# Patient Record
Sex: Female | Born: 1990 | Race: White | Hispanic: No | Marital: Single | Smoking: Never smoker
Health system: Southern US, Community
[De-identification: ages and names within clinical notes are randomized; demographics above are authoritative.]

## PROBLEM LIST (undated history)

## (undated) DIAGNOSIS — Z1159 Encounter for screening for other viral diseases: Secondary | ICD-10-CM

## (undated) DIAGNOSIS — B009 Herpesviral infection, unspecified: Secondary | ICD-10-CM

## (undated) DIAGNOSIS — S060X9A Concussion with loss of consciousness of unspecified duration, initial encounter: Secondary | ICD-10-CM

## (undated) DIAGNOSIS — E162 Hypoglycemia, unspecified: Secondary | ICD-10-CM

## (undated) DIAGNOSIS — G43109 Migraine with aura, not intractable, without status migrainosus: Secondary | ICD-10-CM

## (undated) HISTORY — DX: Concussion with loss of consciousness of unspecified duration, initial encounter: S06.0X9A

## (undated) HISTORY — DX: Encounter for screening for other viral diseases: Z11.59

## (undated) HISTORY — DX: Hypoglycemia, unspecified: E16.2

## (undated) HISTORY — DX: Herpesviral infection, unspecified: B00.9

## (undated) HISTORY — DX: Migraine with aura, not intractable, without status migrainosus: G43.109

---
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
## 2012-05-29 ENCOUNTER — Ambulatory Visit (INDEPENDENT_AMBULATORY_CARE_PROVIDER_SITE_OTHER): Payer: Managed Care, Other (non HMO) | Admitting: Family Medicine

## 2012-05-29 VITALS — BP 108/68 | HR 94 | Temp 98.8°F | Resp 16 | Ht 67.5 in | Wt 131.0 lb

## 2012-05-29 DIAGNOSIS — B349 Viral infection, unspecified: Secondary | ICD-10-CM

## 2012-05-29 DIAGNOSIS — B9789 Other viral agents as the cause of diseases classified elsewhere: Secondary | ICD-10-CM

## 2012-05-29 DIAGNOSIS — J209 Acute bronchitis, unspecified: Secondary | ICD-10-CM

## 2012-05-29 DIAGNOSIS — R059 Cough, unspecified: Secondary | ICD-10-CM

## 2012-05-29 DIAGNOSIS — R05 Cough: Secondary | ICD-10-CM

## 2012-05-29 LAB — POCT CBC
Granulocyte percent: 75.8 %G (ref 37–80)
HCT, POC: 37.8 % (ref 37.7–47.9)
Hemoglobin: 12 g/dL — AB (ref 12.2–16.2)
POC Granulocyte: 6.1 (ref 2–6.9)
POC LYMPH PERCENT: 14.5 %L (ref 10–50)
RDW, POC: 12.5 %

## 2012-05-29 MED ORDER — PSEUDOEPHEDRINE HCL ER 120 MG PO TB12: 120.0000 mg | ORAL_TABLET | Freq: Two times a day (BID) | ORAL | Status: DC

## 2012-05-29 MED ORDER — HYDROCODONE-HOMATROPINE 5-1.5 MG/5ML PO SYRP: 5.0000 mL | ORAL_SOLUTION | Freq: Three times a day (TID) | ORAL | Status: DC | PRN

## 2012-05-29 MED ORDER — IPRATROPIUM BROMIDE 0.03 % NA SOLN: 2.0000 | Freq: Four times a day (QID) | NASAL | Status: DC

## 2012-05-29 NOTE — Patient Instructions (Addendum)
I recommend frequent warm salt water gargles, hot tea with honey and lemon, rest, and handwashing.  Hot showers or breathing in steam may help loosen the congestion.  Try a netti pot or sinus rinse is also likely to help you feel better and keep this from progressing.   Bronchitis Bronchitis is the body's way of reacting to injury and/or infection (inflammation) of the bronchi. Bronchi are the air tubes that extend from the windpipe into the lungs. If the inflammation becomes severe, it may cause shortness of breath. CAUSES  Inflammation may be caused by:  A virus.  Germs (bacteria).  Dust.  Allergens.  Pollutants and many other irritants. The cells lining the bronchial tree are covered with tiny hairs (cilia). These constantly beat upward, away from the lungs, toward the mouth. This keeps the lungs free of pollutants. When these cells become too irritated and are unable to do their job, mucus begins to develop. This causes the characteristic cough of bronchitis. The cough clears the lungs when the cilia are unable to do their job. Without either of these protective mechanisms, the mucus would settle in the lungs. Then you would develop pneumonia. Smoking is a common cause of bronchitis and can contribute to pneumonia. Stopping this habit is the single most important thing you can do to help yourself. TREATMENT   Your caregiver may prescribe an antibiotic if the cough is caused by bacteria. Also, medicines that open up your airways make it easier to breathe. Your caregiver may also recommend or prescribe an expectorant. It will loosen the mucus to be coughed up. Only take over-the-counter or prescription medicines for pain, discomfort, or fever as directed by your caregiver.  Removing whatever causes the problem (smoking, for example) is critical to preventing the problem from getting worse.  Cough suppressants may be prescribed for relief of cough symptoms.  Inhaled medicines may be  prescribed to help with symptoms now and to help prevent problems from returning.  For those with recurrent (chronic) bronchitis, there may be a need for steroid medicines. SEEK IMMEDIATE MEDICAL CARE IF:   During treatment, you develop more pus-like mucus (purulent sputum).  You have a fever.  Your baby is older than 3 months with a rectal temperature of 102 F (38.9 C) or higher.  Your baby is 13 months old or younger with a rectal temperature of 100.4 F (38 C) or higher.  You become progressively more ill.  You have increased difficulty breathing, wheezing, or shortness of breath. It is necessary to seek immediate medical care if you are elderly or sick from any other disease. MAKE SURE YOU:   Understand these instructions.  Will watch your condition.  Will get help right away if you are not doing well or get worse. Document Released: 12/26/2004 Document Revised: 03/20/2011 Document Reviewed: 11/05/2007 [facility] Patient Information 2014 [place], [place]. Acute Bronchitis You have acute bronchitis. This means you have a chest cold. The airways in your lungs are red and sore (inflamed). Acute means it is sudden onset.  CAUSES Bronchitis is most often caused by the same virus that causes a cold. SYMPTOMS   Body aches.  Chest congestion.  Chills.  Cough.  Fever.  Shortness of breath.  Sore throat. TREATMENT  Acute bronchitis is usually treated with rest, fluids, and medicines for relief of fever or cough. Most symptoms should go away after a few days or a week. Increased fluids may help thin your secretions and will prevent dehydration. Your caregiver may give  you an inhaler to improve your symptoms. The inhaler reduces shortness of breath and helps control cough. You can take over-the-counter pain relievers or cough medicine to decrease coughing, pain, or fever. A cool-air vaporizer may help thin bronchial secretions and make it easier to clear your chest. Antibiotics  are usually not needed but can be prescribed if you smoke, are seriously ill, have chronic lung problems, are elderly, or you are at higher risk for developing complications.Allergies and asthma can make bronchitis worse. Repeated episodes of bronchitis may cause longstanding lung problems. Avoid smoking and secondhand smoke.Exposure to cigarette smoke or irritating chemicals will make bronchitis worse. If you are a cigarette smoker, consider using nicotine gum or skin patches to help control withdrawal symptoms. Quitting smoking will help your lungs heal faster. Recovery from bronchitis is often slow, but you should start feeling better after 2 to 3 days. Cough from bronchitis frequently lasts for 3 to 4 weeks. To prevent another bout of acute bronchitis:  Quit smoking.  Wash your hands frequently to get rid of viruses or use a hand sanitizer.  Avoid other people with cold or virus symptoms.  Try not to touch your hands to your mouth, nose, or eyes. SEEK IMMEDIATE MEDICAL CARE IF:  You develop increased fever, chills, or chest pain.  You have severe shortness of breath or bloody sputum.  You develop dehydration, fainting, repeated vomiting, or a severe headache.  You have no improvement after 1 week of treatment or you get worse. MAKE SURE YOU:   Understand these instructions.  Will watch your condition.  Will get help right away if you are not doing well or get worse. Document Released: 02/03/2004 Document Revised: 03/20/2011 Document Reviewed: 04/20/2010 [facility] Patient Information 2014 [place], [place].

## 2012-05-29 NOTE — Progress Notes (Signed)
Subjective:    Patient ID: Margaret Cole, female    DOB: May 27, 1990, 22 y.o.   MRN: [id number]   Chief Complaint  Patient presents with  . Cough    x 4 days  . Sore Throat  . Back Pain  . Otalgia  . Nasal Congestion    HPI 4d ago - woke w/ little sore throat and that night started to cough and cough worsened and everything worsening.  Coughing up a little but getting stuck in throat, little bit of runny nose - white clear.  Felt a little feverish w/ sweats and chills but not documented. Both ears - behind them sore, no lymphnodes. No HAs but a lot of back aches, no sinus pressure, no pnd. Voiding normally, drinking well, eating well. Normal bowels now. Did have some diarrhea 3d ago Only slept a little - cough and back pain keeping her up.  No known sick contacts.  Trying delsym which hasn't been working very well.  On period now.  Had mono 1-2 yrs ago  History reviewed. No pertinent past medical history. No current outpatient prescriptions on file prior to visit.   Current Facility-Administered Medications on File Prior to Visit  Medication Dose Route Frequency Provider Last Rate Last Dose  . meningococcal polysaccharide (MENACTRA) injection 0.5 mL  0.5 mL Intramuscular Once Shade Flood, MD      . Lady Gary Leda Min) injection 0.5 mL  0.5 mL Intramuscular Once Shade Flood, MD       No Known Allergies   Review of Systems  Constitutional: Positive for fever, chills, diaphoresis, activity change and fatigue. Negative for appetite change and unexpected weight change.  HENT: Positive for ear pain, congestion, sore throat and rhinorrhea. Negative for postnasal drip and sinus pressure.   Respiratory: Positive for cough.   Gastrointestinal: Positive for diarrhea. Negative for nausea, vomiting, abdominal pain and constipation.  Genitourinary: Positive for vaginal bleeding. Negative for frequency and decreased urine volume.  Musculoskeletal: Positive for back pain.  Neurological:  Negative for headaches.  Hematological: Negative for adenopathy.  Psychiatric/Behavioral: Positive for sleep disturbance.       BP 108/68  Pulse 94  Temp(Src) 98.8 F (37.1 C)  Resp 16  Ht 5' 7.5" (1.715 m)  Wt 131 lb (59.421 kg)  BMI 20.2 kg/m2  SpO2 97%  LMP 05/29/2012 Objective:   Physical Exam  HENT:  Right Ear: A middle ear effusion is present.  Left Ear: A middle ear effusion is present.  Nose: Mucosal edema and rhinorrhea present.  Mouth/Throat: Posterior oropharyngeal edema and posterior oropharyngeal erythema present. No oropharyngeal exudate or tonsillar abscesses.  Tonsils 3+ bilaterally  Abdominal: There is no hepatosplenomegaly. There is tenderness in the right upper quadrant. There is no CVA tenderness.  Lymphadenopathy:       Head (right side): Submandibular and tonsillar adenopathy present.       Head (left side): Submandibular and tonsillar adenopathy present.    She has cervical adenopathy.       Right cervical: Superficial cervical and posterior cervical adenopathy present.       Left cervical: Superficial cervical and posterior cervical adenopathy present.      Results for orders placed in visit on 05/29/12  POCT CBC      Result Value Range   WBC 8.0  4.6 - 10.2 K/uL   Lymph, poc 1.2  0.6 - 3.4   POC LYMPH PERCENT 14.5  10 - 50 %L   MID (cbc) 0.8  0 -  0.9   POC MID % 9.7  0 - 12 %M   POC Granulocyte 6.1  2 - 6.9   Granulocyte percent 75.8  37 - 80 %G   RBC 3.85 (*) 4.04 - 5.48 M/uL   Hemoglobin 12.0 (*) 12.2 - 16.2 g/dL   HCT, POC 16.1  09.6 - 47.9 %   MCV 98.1 (*) 80 - 97 fL   MCH, POC 31.2  27 - 31.2 pg   MCHC 31.7 (*) 31.8 - 35.4 g/dL   RDW, POC 04.5     Platelet Count, POC 163  142 - 424 K/uL   MPV 9.3  0 - 99.8 fL    Assessment & Plan:  Cough - Plan: POCT CBC  Viral syndrome  Acute bronchitis  Meds ordered this encounter  Medications  . HYDROcodone-homatropine (HYCODAN) 5-1.5 MG/5ML syrup    Sig: Take 5 mLs by mouth every 8  (eight) hours as needed for cough.    Dispense:  120 mL    Refill:  0  . pseudoephedrine (SUDAFED 12 HOUR) 120 MG 12 hr tablet    Sig: Take 1 tablet (120 mg total) by mouth every 12 (twelve) hours.    Dispense:  30 tablet    Refill:  0  .  ipratropium (ATROVENT) 0.03 % nasal spray    Sig: Place 2 sprays into the nose 4 (four) times daily.    Dispense:  30 mL    Refill:  1

## 2012-08-28 ENCOUNTER — Encounter: Payer: Self-pay | Admitting: Obstetrics and Gynecology

## 2012-08-28 ENCOUNTER — Ambulatory Visit (INDEPENDENT_AMBULATORY_CARE_PROVIDER_SITE_OTHER): Payer: Managed Care, Other (non HMO) | Admitting: Obstetrics and Gynecology

## 2012-08-28 VITALS — BP 100/66 | HR 76 | Ht 66.5 in | Wt 129.0 lb

## 2012-08-28 DIAGNOSIS — Z23 Encounter for immunization: Secondary | ICD-10-CM

## 2012-08-28 DIAGNOSIS — Z0000 Encounter for general adult medical examination without abnormal findings: Secondary | ICD-10-CM

## 2012-08-28 DIAGNOSIS — N920 Excessive and frequent menstruation with regular cycle: Secondary | ICD-10-CM

## 2012-08-28 DIAGNOSIS — N760 Acute vaginitis: Secondary | ICD-10-CM

## 2012-08-28 DIAGNOSIS — N946 Dysmenorrhea, unspecified: Secondary | ICD-10-CM

## 2012-08-28 DIAGNOSIS — Z01419 Encounter for gynecological examination (general) (routine) without abnormal findings: Secondary | ICD-10-CM

## 2012-08-28 LAB — POCT URINALYSIS DIPSTICK
Blood, UA: NEGATIVE
Glucose, UA: NEGATIVE
Nitrite, UA: NEGATIVE
Protein, UA: NEGATIVE
Urobilinogen, UA: NEGATIVE

## 2012-08-28 LAB — HEMOGLOBIN, FINGERSTICK: Hemoglobin, fingerstick: 14.5 g/dL (ref 12.0–16.0)

## 2012-08-28 MED ORDER — NORETHIN ACE-ETH ESTRAD-FE 1-20 MG-MCG(24) PO TABS: 1.0000 | ORAL_TABLET | Freq: Every day | ORAL | Status: DC

## 2012-08-28 NOTE — Patient Instructions (Addendum)
EXERCISE AND DIET:  We recommended that you start or continue a regular exercise program for good health. Regular exercise means any activity that makes your heart beat faster and makes you sweat.  We recommend exercising at least 30 minutes per day at least 3 days a week, preferably 4 or 5.  We also recommend a diet low in fat and sugar.  Inactivity, poor dietary choices and obesity can cause diabetes, heart attack, stroke, and kidney damage, among others.    ALCOHOL AND SMOKING:  Women should limit their alcohol intake to no more than 7 drinks/beers/glasses of wine (combined, not each!) per week. Moderation of alcohol intake to this level decreases your risk of breast cancer and liver damage. And of course, no recreational drugs are part of a healthy lifestyle.  And absolutely no smoking or even second hand smoke. Most people know smoking can cause heart and lung diseases, but did you know it also contributes to weakening of your bones? Aging of your skin?  Yellowing of your teeth and nails?  CALCIUM AND VITAMIN D:  Adequate intake of calcium and Vitamin D are recommended.  The recommendations for exact amounts of these supplements seem to change often, but generally speaking 600 mg of calcium (either carbonate or citrate) and 800 units of Vitamin D per day seems prudent. Certain women may benefit from higher intake of Vitamin D.  If you are among these women, your doctor will have told you during your visit.    PAP SMEARS:  Pap smears, to check for cervical cancer or precancers,  have traditionally been done yearly, although recent scientific advances have shown that most women can have pap smears less often.  However, every woman still should have a physical exam from her gynecologist every year. It will include a breast check, inspection of the vulva and vagina to check for abnormal growths or skin changes, a visual exam of the cervix, and then an exam to evaluate the size and shape of the uterus and  ovaries.  And after 20 years of age, a rectal exam is indicated to check for rectal cancers. We will also provide age appropriate advice regarding health maintenance, like when you should have certain vaccines, screening for sexually transmitted diseases, bone density testing, colonoscopy, mammograms, etc.   MAMMOGRAMS:  All women over 40 years old should have a yearly mammogram. Many facilities now offer a "3D" mammogram, which may cost around $50 extra out of pocket. If possible,  we recommend you accept the option to have the 3D mammogram performed.  It both reduces the number of women who will be called back for extra views which then turn out to be normal, and it is better than the routine mammogram at detecting truly abnormal areas.    COLONOSCOPY:  Colonoscopy to screen for colon cancer is recommended for all women at age 50.  We know, you hate the idea of the prep.  We agree, BUT, having colon cancer and not knowing it is worse!!  Colon cancer so often starts as a polyp that can be seen and removed at colonscopy, which can quite literally save your life!  And if your first colonoscopy is normal and you have no family history of colon cancer, most women don't have to have it again for 10 years.  Once every ten years, you can do something that may end up saving your life, right?  We will be happy to help you get it scheduled when you are ready.    Be sure to check your insurance coverage so you understand how much it will cost.  It may be covered as a preventative service at no cost, but you should check your particular policy.    HPV Vaccine Questions and Answers WHAT IS HUMAN PAPILLOMAVIRUS (HPV)? HPV is a virus that can lead to cervical cancer; vulvar and vaginal cancers; penile cancer; anal cancer and genital warts (warts in the genital areas). More than 1 vaccine is available to help you or your child with protection against HPV. Your caregiver can talk to you about which one might give you the  best protection. WHO SHOULD GET THIS VACCINE? The HPV vaccine is most effective when given before the onset of sexual activity.  This vaccine is recommended for girls 25 or 20 years of age. It can be given to girls as young as 20 years old.  HPV vaccine can be given to males, 9 through 20 years of age, to reduce the likelihood of acquiring genital warts.  HPV vaccine can be given to males and females aged 37 through 26 years to prevent anal cancer. HPV vaccine is not generally recommended after age 61, because most individuals have been exposed to the HPV virus by that age. HOW EFFECTIVE IS THIS VACCINE?  The vaccine is generally effective in preventing cervical; vulvar and vaginal cancers; penile cancer; anal cancer and genital warts caused by 4 types of HPV. The vaccine is less effective in those individuals who are already infected with HPV. This vaccine does not treat existing HPV, genital warts, pre-cancers or cancers. WILL SEXUALLY ACTIVE INDIVIDUALS BENEFIT FROM THE VACCINE? Sexually active individuals may still benefit from the vaccine but may get less benefit due to previous HPV exposure. HOW AND WHEN IS THE VACCINE ADMINISTERED? The vaccine is given in a series of 3 injections (shots) over a 6 month period in both males and females. The exact timing depends on which specific vaccine your caregiver recommends for you. IS THE HPV VACCINE SAFE?  The federal government has approved the HPV vaccine as safe and effective. This vaccine was tested in both males and females in many countries around the world. The most common side effect is soreness at the injection site. Since the drug became approved, there has been some concern about patients passing out after being vaccinated, which has led to a recommendation of a 15 minute waiting period following vaccination. This practice may decrease the small risk of passing out. Additionally there is a rare risk of anaphylaxis (an allergic reaction) to the  vaccine and a risk of a blood clot among individuals with specific risk factors for a blood clot. DOES THIS VACCINE CONTAIN THIMEROSAL OR MERCURY? No. There is no thimerosal or mercury in the HPV vaccine. It is made of proteins from the outer coat of the virus (HPV). There is no infectious material in this vaccine. WILL GIRLS/WOMEN WHO HAVE BEEN VACCINATED STILL NEED CERVICAL CANCER SCREENING? Yes. There are 3 reasons why women will still need regular cervical cancer screening. First, the vaccine will NOT provide protection against all types of HPV that cause cervical cancer. Vaccinated women will still be at risk for some cancers. Second, some women may not get all required doses of the vaccine (or they may not get them at the recommended times). Therefore, they may not get the vaccine's full benefits. Third, women may not get the full benefit of the vaccine if they receive it after they have already acquired any of the 4 types  of HPV. WILL THE HPV VACCINE BE COVERED BY INSURANCE PLANS? While some insurance companies may cover the vaccine, others may not. Most large group insurance plans cover the costs of recommended vaccines. WHAT KIND OF GOVERNMENT PROGRAMS MAY BE AVAILABLE TO COVER HPV VACCINE? Federal health programs such as Vaccines for [facility]) will cover the HPV vaccine. The [facility] program provides free vaccines to children and adolescents under 41 years of age, who are either uninsured, Medicaid-eligible, American [place] or [place] Native. There are over 45,000 sites that provide [facility] vaccines including hospital, private and public clinics. The [facility] program also allows children and adolescents to get VFC vaccines through [facility] or [facility] if their private health insurance does not cover the vaccine. Some states also provide free or low-cost vaccines, at public health clinics, to people without health insurance coverage for vaccines. GENITAL HPV: WHY IS HPV  IMPORTANT? Genital HPV is the most common virus transmitted through genital contact, most often during vaginal and anal sex. About 40 types of HPV can infect the genital areas of men and women. While most HPV types cause no symptoms and go away on their own, some types can cause cervical cancer in women. These types also cause other less common genital cancers, including cancers of the penis, anus, vagina (birth canal), and vulva (area around the opening of the vagina). Other types of HPV can cause genital warts in men and women. HOW COMMON IS HPV?   At least 50% of sexually active people will get HPV at some time in their lives. HPV is most common in young women and men who are in their late teens and early 72s.  Anyone who has ever had genital contact with another person can get HPV. Both men and women can get it and pass it on to their sex partners without realizing it. IS HPV THE SAME THING AS HIV OR HERPES? HPV is NOT the same as HIV or Herpes (Herpes simplex virus or HSV). While these are all viruses that can be sexually transmitted, HIV and HSV do not cause the same symptoms or health problems as HPV. CAN HPV AND ITS ASSOCIATED DISEASES BE TREATED? There is no treatment for HPV. There are treatments for the health problems that HPV can cause, such as genital warts, cervical cell changes, and cancers of the cervix (lower part of the womb), vulva, vagina and anus.  HOW IS HPV RELATED TO CERVICAL CANCER? Some types of HPV can infect a woman's cervix and cause the cells to change in an abnormal way. Most of the time, HPV goes away on its own. When HPV is gone, the cervical cells go back to normal. Sometimes, HPV does not go away. Instead, it lingers (persists) and continues to change the cells on a woman's cervix. These cell changes can lead to cancer over time if they are not treated. ARE THERE OTHER WAYS TO PREVENT CERVICAL CANCER? Regular Pap tests and follow-up can prevent most, but not all,  cases of cervical cancer. Pap tests can detect cell changes (or pre-cancers) in the cervix before they turn into cancer. Pap tests can also detect most, but not all, cervical cancers at an early, curable stage. Most women diagnosed with cervical cancer have either never had a Pap test, or not had a Pap test in the last 5 years. There is also an HPV DNA test available for use with the Pap test as part of cervical cancer screening. This test may be  ordered for women over 30 or for women who get an unclear (borderline) Pap test result. While this test can tell if a woman has HPV on her cervix, it cannot tell which types of HPV she has. If the HPV DNA test is negative for HPV DNA, then screening may be done every 3 years. If the HPV DNA test is positive for HPV DNA, then screening should be done every 6 to 12 months. OTHER QUESTIONS ABOUT THE HPV VACCINE WHAT HPV TYPES DOES THE VACCINE PROTECT AGAINST? The HPV vaccine protects against the HPV types that cause most (70%) cervical cancers (types 16 and 18), most (78%) anal cancers (types 16 and 18) and the two HPV types that cause most (90%) genital warts (types 6 and 11). WHAT DOES THE VACCINE NOT PROTECT AGAINST?  Because the vaccine does not protect against all types of HPV, it will not prevent all cases of cervical cancer, anal cancer, other genital cancers or genital warts. About 30% of cervical cancers are not prevented with vaccination, so it will be important for women to continue screening for cervical cancer (regular Pap tests). Also, the vaccine does not prevent about 10% of genital warts nor will it prevent other sexually transmitted infections (STIs), including HIV. Therefore, it will still be important for sexually active adults to practice safe sex to reduce exposure to HPV and other STI's. HOW LONG DOES VACCINE PROTECTION LAST? WILL A BOOSTER SHOT BE NEEDED? So far, studies have followed women for 5 years and found that they are still protected.  Currently, additional (booster) doses are not recommended. More research is being done to find out how long protection will last, and if a booster vaccine is needed years later.  WHY IS THE HPV VACCINE RECOMMENDED AT SUCH A YOUNG AGE? Ideally, males and females should get the vaccine before they are sexually active since this vaccine is most effective in individuals who have not yet acquired any of the HPV vaccine types. Individuals who have not been infected with any of the 4 types of HPV will get the full benefits of the vaccine.  SHOULD PREGNANT WOMEN BE VACCINATED? The vaccine is not recommended for pregnant women. There has been limited research looking at vaccine safety for pregnant women and their developing fetus. Studies suggest that the vaccine has not caused health problems during pregnancy, nor has it caused health problems for the infant. Pregnant women should complete their pregnancy before getting the vaccine. If a woman finds out she is pregnant after she has started getting the vaccine series, she should complete her pregnancy before finishing the 3 doses. SHOULD BREASTFEEDING MOTHERS BE VACCINATED? Mothers nursing their babies may get the vaccine because the virus is inactivated and will not harm the mother or baby. WILL INDIVIDUALS BE PROTECTED AGAINST HPV AND RELATED DISEASES, EVEN IF THEY DO NOT GET ALL 3 DOSES? It is not yet known how much protection individuals will get from receiving only 1 or 2 doses of the vaccine. For this reason, it is very important that individuals get all 3 doses of the vaccine. WILL CHILDREN BE REQUIRED TO BE VACCINATED TO ENTER SCHOOL? There are no federal laws that require children or adolescents to get vaccinated. All school entry laws are state laws so they vary from state to state. To find out what vaccines are needed for children or adolescents to enter school in your state, check with your state health department or board of education. ARE THERE  OTHER WAYS TO PREVENT  HPV? The only sure way to prevent HPV is to abstain from all sexual activity. Sexually active adults can reduce their risk by being in a mutually monogamous relationship with someone who has had no other sex partners. But even individuals with only 1 lifetime sex partner can get HPV, if their partner has had a previous partner with HPV. It is unknown how much protection condoms provide against HPV, since areas that are not covered by a condom can be exposed to the virus. However, condoms may reduce the risk of genital warts and cervical cancer. They can also reduce the risk of HIV and some other sexually transmitted infections (STIs), when used consistently and correctly (all the time and the right way). Document Released: 12/26/2004 Document Revised: 03/20/2011 Document Reviewed: 08/21/2008 [facility] Patient Information 2014 [place], [place]. Oral Contraception Information Oral contraceptives (OCs) are medicines taken to prevent pregnancy. OCs work by preventing the ovaries from releasing eggs. The hormones in OCs also cause the cervical mucus to thicken, preventing the sperm from entering the uterus. The hormones also cause the uterine lining to become thin, not allowing a fertilized egg to attach to the inside of the uterus. OCs are highly effective when taken exactly as prescribed. However, OCs do not prevent sexually transmitted diseases (STDs). Safe sex practices, such as using condoms along with the pill, can help prevent STDs.  Before taking the pill, you may have a physical exam and Pap test. Your caregiver may order blood tests that may be necessary. Your caregiver will make sure you are a good candidate for oral contraception. Discuss with your caregiver the possible side effects of the OC you may be prescribed. When starting an OC, it can take 2 to 3 months for the body to adjust to the changes in hormone levels in your body.  TYPES OF ORAL CONTRACEPTION  The combination  pill. This pill contains estrogen and progestin (synthetic progesterone) hormones. The combination pill comes in either 21-day or 28-day packs. With 21-day packs, you do not take pills for 7 days after the last pill. With 28-day packs, the pill is taken every day. The last 7 pills are without hormones. Certain types of pills have more than 21 hormone-containing pills.  The minipill. This pill contains the progesterone hormone only. It is taken every day continuously. The minipill comes in packs of 91 pills. The first 84 pills contain the hormones, and the last 7 pills do not. The last 7 days are when you will have your menstrual period. You may experience irregular spotting. ADVANTAGES  Decreases premenstrual symptoms.  Treats menstrual period cramps.  Regulates the menstrual cycle.  Decreases a heavy menstrual flow.  Treats acne.  Treats abnormal uterine bleeding.  Treats chronic pelvic pain.  Treats polycystic ovarian syndrome.  Treats endometriosis.  Can be used as emergency contraception. DISADVANTAGES OCs can be less effective if:  You forget to take the pill at the same time every day.  You have a stomach or intestinal disease that lessens the absorption of the pill.  You take OCs with other medicines that make OCs less effective.  You take expired OCs.  You forget to restart the pill on day 7, when using the packs of 21 pills. Document Released: 03/18/2002 Document Revised: 03/20/2011 Document Reviewed: 05/04/2010 [facility] Patient Information 2014 [place], [place].

## 2012-08-28 NOTE — Progress Notes (Signed)
Patient ID: Margaret Cole, female   DOB: 04-09-1990, 22 y.o.   MRN: [id number]   PCP - Urgent Care at [facility]  22 y.o.   Single    Caucasian   female   G0P0   here for annual exam.  LMP 08/05/12. Has menstrual periods every 19 - 25 days. First couple days are heavy - pad and tampon and soaks through. Cramps started a couple of months ago. Takes Tylenol for cramping, and it doesn't work well.  When not on cycle, has discharge all the time.  Yellow or clear, sometimes itching, no odor.  Never sexually active.  Going to school next week in [place].  Never got Gardasil.   No history of breast, liver, circulatory problems, HTN.  No family history of DVT or PE.    Patient's last menstrual period was 08/05/2012.          Sexually active: no  The current method of family planning is abstinence.    Exercising: zumba, Judeth Cornfield Do Last mammogram:  never Last pap smear: never History of abnormal pap: no Smoking: no Alcohol: no Last colonoscopy: never Last Bone Density:  never Last tetanus shot: 06/2011 Gardasil vaccine:  Did not do.   Wants it but just never did it.  Last cholesterol check: never  Hgb:  14.5              Urine: Neg   Family History  Problem Relation Age of Onset  . Hypertension Father   . Stroke Paternal Grandfather     There are no active problems to display for this patient.   Past Medical History  Diagnosis Date  . Hypoglycemia     History reviewed. No pertinent past surgical history.  Allergies: Review of patient's allergies indicates no known allergies.  Current Outpatient Prescriptions  Medication Sig Dispense Refill  . HYDROcodone-homatropine (HYCODAN) 5-1.5 MG/5ML syrup Take 5 mLs by mouth every 8 (eight) hours as needed for cough.  120 mL  0  . ipratropium (ATROVENT) 0.03 % nasal spray Place 2 sprays into the nose 4 (four) times daily.  30 mL  1  . pseudoephedrine (SUDAFED 12 HOUR) 120 MG 12 hr tablet Take 1 tablet (120 mg total) by mouth  every 12 (twelve) hours.  30 tablet  0   Current Facility-Administered Medications  Medication Dose Route Frequency Provider Last Rate Last Dose  . meningococcal polysaccharide (MENACTRA) injection 0.5 mL  0.5 mL Intramuscular Once Shade Flood, MD      . TDaP Leda Min) injection 0.5 mL  0.5 mL Intramuscular Once Shade Flood, MD        ROS: Pertinent items are noted in HPI.  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  Studying [place], business, and finance.  Exam:    BP 100/66  Pulse 76  Ht 5' 6.5" (1.689 m)  Wt 129 lb (58.514 kg)  BMI 20.51 kg/m2  LMP 08/05/2012   Wt Readings from Last 3 Encounters:  08/28/12 129 lb (58.514 kg) (52%*, Z = 0.06)  05/29/12 131 lb (59.421 kg) (57%*, Z = 0.17)  06/15/11 124 lb (56.246 kg) (48%*, Z = -0.05)   * Growth percentiles are based on CDC 2-20 Years data.     Ht Readings from Last 3 Encounters:  08/28/12 5' 6.5" (1.689 m) (81%*, Z = 0.86)  05/29/12 5' 7.5" (1.715 m) (90%*, Z = 1.27)  06/15/11 5\' 7"  (1.702 m) (86%*, Z = 1.08)   *  Growth percentiles are based on CDC 2-20 Years data.    General appearance: alert, cooperative and appears stated age Head: Normocephalic, without obvious abnormality, atraumatic Neck: no adenopathy, supple, symmetrical, trachea midline and thyroid not enlarged, symmetric, no tenderness/mass/nodules Lungs: clear to auscultation bilaterally Breasts: Inspection negative, No nipple retraction or dimpling, No nipple discharge or bleeding, No axillary or supraclavicular adenopathy, Normal to palpation without dominant masses Heart: regular rate and rhythm Abdomen: soft, non-tender; bowel sounds normal; no masses,  no organomegaly Extremities: extremities normal, atraumatic, no cyanosis or edema Skin: Skin color, texture, turgor normal. No rashes or lesions Lymph nodes: Cervical, supraclavicular, and axillary nodes normal. No abnormal inguinal nodes palpated Neurologic: Grossly normal   Pelvic:  External genitalia:  no lesions              Urethra:  normal appearing urethra with no masses, tenderness or lesions              Bartholins and Skenes: normal                 Vagina: normal appearing vagina with normal color, no lesions.  Yellowish discharge noted.               Cervix: normal appearance              Pap taken: no        Bimanual Exam:  Uterus:  uterus is normal size, shape, consistency and nontender                                      Adnexa: normal adnexa in size, nontender and no masses                                      Rectovaginal: Confirms                                      Anus:  normal sphincter tone, no lesions  Wet prep - ph 4.5, negative for clue cells, hyphae, and trichomonas  Assessment  Menorrhagia Dysmenorrhea Irregular menses. Nonspecific vaginitis.  Normal discharge today.   Need for [facility]strin 24, 3 packs with 3 refills.  See Epic orders.  Proper use, side effects, and warning signs discussed.   Discussed condoms when becomes sexually active. Patient educated about physiologic vaginal discharge. Start Gardasil series today.  Recheck in 2 months.   After visit summary to patient.    An After Visit Summary was printed and given to the patient.

## 2012-08-28 NOTE — Progress Notes (Signed)
Patient tolerated #1 Gardasil vaccine well.  Next injection due in 2 months.

## 2012-10-25 ENCOUNTER — Encounter: Payer: Self-pay | Admitting: Obstetrics and Gynecology

## 2012-10-25 ENCOUNTER — Ambulatory Visit (INDEPENDENT_AMBULATORY_CARE_PROVIDER_SITE_OTHER): Payer: Managed Care, Other (non HMO) | Admitting: Obstetrics and Gynecology

## 2012-10-25 VITALS — BP 100/60 | HR 80 | Resp 20 | Wt 127.0 lb

## 2012-10-25 DIAGNOSIS — Z23 Encounter for immunization: Secondary | ICD-10-CM

## 2012-10-25 MED ORDER — DROSPIRENONE-ETHINYL ESTRADIOL 3-0.03 MG PO TABS: 1.0000 | ORAL_TABLET | Freq: Every day | ORAL | Status: DC

## 2012-10-25 NOTE — Patient Instructions (Addendum)
Please return for your final Gardasil as scheduled.

## 2012-10-25 NOTE — Progress Notes (Signed)
GYNECOLOGY PROBLEM VISIT   HPI:  22 y.o. Single Caucasian female  G0P0 here for pill recheck and second Gardasil injection.  Patient on LoEstrin 24 Fe. Menses lighter.  Cramps not better.  Acne. Takes on time every night.   Not sexually active.    In college in [place].  Home on break.    OB History   Grav Para Term Preterm Abortions TAB SAB Ect Mult Living   0                  Family History  Problem Relation Age of Onset  . Hypertension Father   . Stroke Paternal Grandfather     There are no active problems to display for this patient.   Past Medical History  Diagnosis Date  . Hypoglycemia     History reviewed. No pertinent past surgical history.  ALLERGIES: Review of patient's allergies indicates no known allergies.  Current Outpatient Prescriptions  Medication Sig Dispense Refill  . HPV Quadrivalent Vaccine (GARDASIL IM) Inject into the muscle.      . Norethindrone Acetate-Ethinyl Estrad-FE (LOESTRIN 24 FE) 1-20 MG-MCG(24) tablet Take 1 tablet by mouth daily.  3 Package  3  . pseudoephedrine (SUDAFED 12 HOUR) 120 MG 12 hr tablet Take 1 tablet (120 mg total) by mouth every 12 (twelve) hours.  30 tablet  0   Current Facility-Administered Medications  Medication Dose Route Frequency Provider Last Rate Last Dose  . meningococcal polysaccharide (MENACTRA) injection 0.5 mL  0.5 mL Intramuscular Once Shade Flood, MD      . TDaP Leda Min) injection 0.5 mL  0.5 mL Intramuscular Once Shade Flood, MD         ROS:  Pertinent items are noted in HPI.  SOCIAL HISTORY:  Archivist.   PHYSICAL EXAMINATION:    BP 100/60  Pulse 80  Resp 20  Wt 127 lb (57.607 kg)  BMI 20.19 kg/m2  LMP 10/24/2012   Wt Readings from Last 3 Encounters:  10/25/12 127 lb (57.607 kg) (48%*, Z = -0.05)  08/28/12 129 lb (58.514 kg) (52%*, Z = 0.06)  05/29/12 131 lb (59.421 kg) (57%*, Z = 0.17)   * Growth percentiles are based on CDC 2-20 Years data.     Ht Readings  from Last 3 Encounters:  08/28/12 5' 6.5" (1.689 m) (81%*, Z = 0.86)  05/29/12 5' 7.5" (1.715 m) (90%*, Z = 1.27)  06/15/11 5\' 7"  (1.702 m) (86%*, Z = 1.08)   * Growth percentiles are based on CDC 2-20 Years data.    General appearance: alert, cooperative and appears stated age  ASSESSMENT  Menorrhagia  Dysmenorrhea persists. Acne. Need for second Gardasil.   PLAN  Switch to Yasmin OCPs.  I discussed benefits and risks including thromboembolic issues.  See Epic orders.  Second Gardasil today. Return for third Gardasil in 4 months and prn.  An After Visit Summary was printed and given to the patient.

## 2012-12-21 ENCOUNTER — Ambulatory Visit (INDEPENDENT_AMBULATORY_CARE_PROVIDER_SITE_OTHER): Payer: Managed Care, Other (non HMO) | Admitting: Internal Medicine

## 2012-12-21 VITALS — BP 110/76 | HR 97 | Temp 98.0°F | Resp 16 | Ht 67.5 in | Wt 122.4 lb

## 2012-12-21 DIAGNOSIS — J3501 Chronic tonsillitis: Secondary | ICD-10-CM

## 2012-12-21 DIAGNOSIS — J019 Acute sinusitis, unspecified: Secondary | ICD-10-CM

## 2012-12-21 MED ORDER — AMOXICILLIN 875 MG PO TABS: 875.0000 mg | ORAL_TABLET | Freq: Two times a day (BID) | ORAL | Status: DC

## 2012-12-21 NOTE — Progress Notes (Addendum)
Subjective:  This chart was scribed for Margaret Sia, MD by Margaret Cole, Medical Scribe. This patient was seen in Room 11 and the patient's care was started at 3:31 PM.   Patient ID: Margaret Cole, female    DOB: 1990/08/09, 22 y.o.   MRN: [id number]  HPI HPI Comments: Margaret Cole is a 22 y.o. female who presents to the [facility] complaining of left-sided otalgia, cough, rhinorrhea, and sore throat on the left side that started in November.  She states that she saw a doctor [facility] her [facility] and was given Claritin for her symptoms.  She states that she has run out of Claritin and her symptoms have not improved.  The patient lists congestion, sinus pressure, and voice change as associated symptoms.  She denies throat ulcers, fever and abdominal pain as associated symptoms.  She states that her mucous is bloody in color.  She states that she experiences allergies in May for a short period of time.  She states that she had Mononucleosis her senior year of high school.  She denies being allergic to any medications.  She states that she goes to school at [employer] in [place] and she is majoring in Business/Economics with a minor in [place].   She states that her mother also wanted to ask the procedure for getting her tonsils removed.  She states that her throat swells frequently.    Past Medical History  Diagnosis Date  . Hypoglycemia    History reviewed. No pertinent past surgical history. Family History  Problem Relation Age of Onset  . Hypertension Father   . Stroke Paternal Grandfather    History   Social History  . Marital Status: Single    Spouse Name: N/A    Number of Children: N/A  . Years of Education: N/A   Occupational History  . Not on file.   Social History Main Topics  . Smoking status: Never Smoker   . Smokeless tobacco: Never Used  . Alcohol Use: .5 - 1 oz/week    1-2 drink(s) per week     Comment: 1-2  beers a week  . Drug Use: No  . Sexual Activity: No   Other Topics Concern  . Not on file   Social History Narrative  . No narrative on file   No Known Allergies  Review of Systems  Constitutional: Negative for fever.  HENT: Positive for congestion, sinus pressure and voice change. Negative for mouth sores.   Gastrointestinal: Negative for abdominal pain.      Objective:  Physical Exam  Nursing note and vitals reviewed. Constitutional: She is oriented to person, place, and time. She appears well-developed and well-nourished. No distress.  HENT:  Head: Normocephalic and atraumatic.  Right Ear: External ear normal.  Left Ear: External ear normal.  Nares boggy w/ purulence Tender max dependent Throat w/ 2+tonsils-no exud/! Small wart like growth at tip of tonsilar tissue  Eyes: Conjunctivae and EOM are normal. Pupils are equal, round, and reactive to light.  Neck: Normal range of motion. Neck supple.  Cardiovascular: Normal rate, regular rhythm and normal heart sounds.  Exam reveals no gallop and no friction rub.   No murmur heard. Pulmonary/Chest: Effort normal and breath sounds normal. She has no wheezes.  Abdominal: Soft.  Neurological: She is alert and oriented to person, place, and time. No cranial nerve deficit.  Psychiatric: She has a normal mood and affect. Her behavior is normal.  BP 110/76  Pulse 97  Temp(Src) 98 F (36.7 C) (Oral)  Resp 16  Ht 5' 7.5" (1.715 m)  Wt 122 lb 6.4 oz (55.52 kg)  BMI 18.88 kg/m2  SpO2 100%  LMP 11/21/2012    Assessment & Plan:   I personally performed the services described in this documentation, which was scribed in my presence. The recorded information has been reviewed and is accurate.  Chronic tonsillitis - Plan: Ambulatory referral to ENT  Acute sinusitis, unspecified  Meds ordered this encounter  Medications  .  amoxicillin (AMOXIL) 875 MG tablet    Sig: Take 1 tablet (875 mg total) by mouth 2 (two) times  daily.    Dispense:  20 tablet    Refill:  0    To ent for eval

## 2013-01-03 ENCOUNTER — Ambulatory Visit (INDEPENDENT_AMBULATORY_CARE_PROVIDER_SITE_OTHER): Payer: Managed Care, Other (non HMO) | Admitting: Internal Medicine

## 2013-01-03 VITALS — BP 95/60 | HR 73 | Temp 98.2°F | Resp 16 | Ht 67.25 in | Wt 124.0 lb

## 2013-01-03 DIAGNOSIS — J0391 Acute recurrent tonsillitis, unspecified: Secondary | ICD-10-CM

## 2013-01-03 DIAGNOSIS — R059 Cough, unspecified: Secondary | ICD-10-CM

## 2013-01-03 DIAGNOSIS — R05 Cough: Secondary | ICD-10-CM

## 2013-01-03 MED ORDER — HYDROCODONE-HOMATROPINE 5-1.5 MG/5ML PO SYRP: 5.0000 mL | ORAL_SOLUTION | Freq: Four times a day (QID) | ORAL | Status: DC | PRN

## 2013-01-03 MED ORDER — AZITHROMYCIN 250 MG PO TABS: ORAL_TABLET | ORAL | Status: DC

## 2013-01-03 NOTE — Progress Notes (Signed)
   Subjective:    Patient ID: Margaret Cole, female    DOB: Sep 09, 1990, 22 y.o.   MRN: [id number]  HPI Pt here for follow up on last week visit for sore throat which improved with amoxicillin. Referral to ENT -no med changes were suggested tonsillectomy indicated and will be scheduled for summertime  Now she is coughing a lot with slight fever of 100.0. Started 48 hours ago Pt denies asthma   no sore throat Cough slightly productive and interferes with sleep No fatigue   Review of Systems Noncontributory    Objective:   Physical Exam BP 95/60  Pulse 73  Temp(Src) 98.2 F (36.8 C)  Resp 16  Ht 5' 7.25" (1.708 m)  Wt 124 lb (56.246 kg)  BMI 19.28 kg/m2  SpO2 99%  LMP 12/13/2012 Conjunctiva clear TMs clear Nares clear Throat with tonsils that are 1+ with slight exudate on the left No nodes Chest with rhonchi at the bases that clear with cough/no wheezing       Assessment & Plan:  Lower respiratory infection in the setting of recurrent tonsillar problems  I favor being aggressive Meds ordered this encounter  Medications  . HYDROcodone-homatropine (HYCODAN) 5-1.5 MG/5ML syrup    Sig: Take 5 mLs by mouth every 6 (six) hours as needed for cough.    Dispense:  120 mL    Refill:  0  . azithromycin (ZITHROMAX) 250 MG tablet    Sig: As packaged    Dispense:  6 tablet    Refill:  0

## 2013-01-06 ENCOUNTER — Ambulatory Visit: Payer: Managed Care, Other (non HMO)

## 2013-01-06 ENCOUNTER — Ambulatory Visit (INDEPENDENT_AMBULATORY_CARE_PROVIDER_SITE_OTHER): Payer: Managed Care, Other (non HMO) | Admitting: Internal Medicine

## 2013-01-06 VITALS — BP 101/80 | HR 75 | Temp 98.5°F | Resp 18 | Wt 127.0 lb

## 2013-01-06 DIAGNOSIS — R059 Cough, unspecified: Secondary | ICD-10-CM

## 2013-01-06 DIAGNOSIS — R05 Cough: Secondary | ICD-10-CM

## 2013-01-06 DIAGNOSIS — J218 Acute bronchiolitis due to other specified organisms: Secondary | ICD-10-CM

## 2013-01-06 DIAGNOSIS — J219 Acute bronchiolitis, unspecified: Secondary | ICD-10-CM

## 2013-01-06 MED ORDER — BENZONATATE 100 MG PO CAPS: 100.0000 mg | ORAL_CAPSULE | Freq: Three times a day (TID) | ORAL | Status: DC | PRN

## 2013-01-06 NOTE — Patient Instructions (Signed)
Take the tessalon perle 1 tab up to 3 times daily with a full glass of water as needed for cough. Complete the antibiotic. If you develop fever nausea vomiting, rash, chest pain or shortness of breath return to the office.

## 2013-01-06 NOTE — Progress Notes (Signed)
   Subjective:    Patient ID: Margaret Cole, female    DOB: 09-Sep-1990, 22 y.o.   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 Initially diagnosed with a  Sinus infection and treated with amoxil and mucinex. Sinus infection improved than developed a cough and diagnosed with bronchitis. She started on a z pack on 12/26 has one does left still c/o cough does not think she is completely better. No fever no shortness of breath but her cough is increased with deep breaths. No history of asthma. No hx of pe or any blood clots.   Review of Systems  Constitutional: Positive for appetite change and fatigue. Negative for fever, chills, diaphoresis and unexpected weight change.  HENT: Positive for congestion and facial swelling. Negative for ear discharge, ear pain, mouth sores, nosebleeds, postnasal drip, rhinorrhea and sinus pressure.   Respiratory: Positive for cough. Negative for choking, chest tightness, shortness of breath, wheezing and stridor.   Cardiovascular: Negative.   Gastrointestinal: Negative.   Endocrine: Negative.   Genitourinary: Negative.   Musculoskeletal: Negative.   Allergic/Immunologic: Negative.   Neurological: Negative.   Hematological: Negative.   Psychiatric/Behavioral: Negative.   All other systems reviewed and are negative.       Objective:   Physical Exam  Nursing note and vitals reviewed. Constitutional: She appears well-developed and well-nourished. No distress.  HENT:  Head: Normocephalic and atraumatic.  Right Ear: External ear normal.  Left Ear: External ear normal.  Nose: Nose normal.  Eyes: Conjunctivae and EOM are normal. Pupils are equal, round, and reactive to light.  Neck: Normal range of motion. Neck supple.  Cardiovascular: Normal rate and regular rhythm.   Pulmonary/Chest: Effort normal. No respiratory distress. She has no wheezes. She has no rales.  decreased air entry  wit deep breathing this stimulates her to cough  Abdominal: Soft. Bowel sounds are normal.  Musculoskeletal: Normal range of motion.  Neurological: She is alert. She has normal reflexes.  Skin: Skin is warm and dry. She is not diaphoretic.  Psychiatric: She has a normal mood and affect. Her behavior is normal. Judgment and thought content normal.    o2 sat is 100 percent.  [facility] reading (PRIMARY) by  Dr. Mindi Junker no infiltrate..      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 No fever sat 100 percent and dchest xray is negitive for infiltrate. Will suggest completion of antibiotic and tessalon perle for cough.

## 2013-02-28 ENCOUNTER — Ambulatory Visit (INDEPENDENT_AMBULATORY_CARE_PROVIDER_SITE_OTHER): Payer: Managed Care, Other (non HMO) | Admitting: *Deleted

## 2013-02-28 VITALS — BP 114/73 | HR 73 | Resp 18 | Wt 133.0 lb

## 2013-02-28 DIAGNOSIS — Z23 Encounter for immunization: Secondary | ICD-10-CM

## 2013-02-28 NOTE — Progress Notes (Signed)
Patient in for third HPV injection. Patient tolerated it well. - Advised pt to call us if she has any concerns.

## 2013-05-31 ENCOUNTER — Ambulatory Visit (INDEPENDENT_AMBULATORY_CARE_PROVIDER_SITE_OTHER): Payer: Managed Care, Other (non HMO) | Admitting: Emergency Medicine

## 2013-05-31 ENCOUNTER — Ambulatory Visit: Payer: Managed Care, Other (non HMO)

## 2013-05-31 VITALS — BP 100/70 | HR 104 | Temp 97.7°F | Ht 66.5 in | Wt 129.6 lb

## 2013-05-31 DIAGNOSIS — M79609 Pain in unspecified limb: Secondary | ICD-10-CM

## 2013-05-31 DIAGNOSIS — M79669 Pain in unspecified lower leg: Secondary | ICD-10-CM

## 2013-05-31 DIAGNOSIS — M7989 Other specified soft tissue disorders: Secondary | ICD-10-CM

## 2013-05-31 DIAGNOSIS — J039 Acute tonsillitis, unspecified: Secondary | ICD-10-CM

## 2013-05-31 LAB — POCT CBC
Granulocyte percent: 67.8 %G (ref 37–80)
HCT, POC: 39.5 % (ref 37.7–47.9)
Hemoglobin: 12.6 g/dL (ref 12.2–16.2)
Lymph, poc: 1.3 (ref 0.6–3.4)
MCH, POC: 30.6 pg (ref 27–31.2)
MCHC: 31.9 g/dL (ref 31.8–35.4)
MCV: 95.8 fL (ref 80–97)
MID (CBC): 0.5 (ref 0–0.9)
MPV: 8.6 fL (ref 0–99.8)
POC Granulocyte: 3.8 (ref 2–6.9)
POC LYMPH %: 24.1 % (ref 10–50)
POC MID %: 8.1 %M (ref 0–12)
Platelet Count, POC: 221 10*3/uL (ref 142–424)
RBC: 4.12 M/uL (ref 4.04–5.48)
RDW, POC: 12.5 %
WBC: 5.6 10*3/uL (ref 4.6–10.2)

## 2013-05-31 LAB — POCT RAPID STREP A (OFFICE): Rapid Strep A Screen: NEGATIVE

## 2013-05-31 MED ORDER — PREDNISONE 50 MG PO TABS: 50.0000 mg | ORAL_TABLET | Freq: Every day | ORAL | Status: DC

## 2013-05-31 MED ORDER — MELOXICAM 15 MG PO TABS: 15.0000 mg | ORAL_TABLET | Freq: Every day | ORAL | Status: DC

## 2013-05-31 NOTE — Patient Instructions (Signed)
(  1)  Start the prednisone today - take for 3 days.  This will hopefully reduce the swelling in your tonsils.  Consider following up with ENT for tonsillectomy as that will be the definitive cure  (2)  After you finish the prednisone, start taking the meloxicam (Mobic) once daily.  Take for at least 2 weeks.  Relative rest for the left leg.  Ice and elevate frequently.  Stretch your lower legs daily.  Doing ankle circles, pumps, alphabet can also help work the muscles of the front of your leg.  If you are worsening or not seeing improvement, we can refer you to sports medicine for further evaluation and to completely rule out a stress fracture   Shin Splints Shin splints is a painful condition that is felt on the shinbone or in the muscles on either side of the bone (front of your lower leg). Shin splints happen when physical activities, such as sports or other demanding exercise, leads to inflammation of the muscles, tendons, and the thin layer that covers the shinbone.  CAUSES   Overuse of muscles.  Repetitive activities.  Flat feet or rigid arches. Activities that could contribute to shin splints include:  A sudden increase in exercise time.  Starting a new, demanding activity.  Running up hills or long distances.  Playing sports with sudden starts and stops.  A poor warm up.  Old or worn-out shoes. SYMPTOMS   Pain on the front of the leg.  Pain while exercising or at rest. DIAGNOSIS  Your caregiver will diagnose shin splints from a history of your symptoms and a physical exam. You may be observed as you walk or run. X-ray exams or further testing may be needed to rule out other problems, such as a stress fracture, which also causes lower leg pain. TREATMENT  Your caregiver may decide on the treatment based on your age, history, health, and how bad the pain is. Most cases of shin splints can be managed by one or more of the following:  Resting.  Reducing the length and  intensity of your exercise.  Stopping the activity that causes shin pain.  Taking medicines to control the inflammation.  Icing, massaging, stretching, and strengthening the affected area.  Getting shoes with rigid heels, shock absorption, and a good arch support. HOME CARE INSTRUCTIONS   Resume activity steadily or as directed by your caregiver.  Restart your exercise sessions with non-weight-bearing exercises, such as cycling or swimming.  Stop running if the pain returns.  Warm up properly before exercising.  Run on a level and fairly firm surface.  Gradually change the intensity of an exercise.  Limit increases in running distance by no more than 5 to 10% weekly. This means if you are running 5 miles, you can only increase your run by 1/2 a mile at a time.  Change your athletic shoes every 6 months, or every 350 to 450 miles. SEEK MEDICAL CARE IF:   Symptoms continue or worsen even after treatment.  The location, intensity, or type of pain changes over time. SEEK IMMEDIATE MEDICAL CARE IF:   You have severe pain.  You have trouble walking. MAKE SURE YOU:  Understand these instructions.  Will watch your condition.  Will get help right away if you are not doing well or get worse. Document Released: 12/24/1999 Document Revised: 03/20/2011 Document Reviewed: 06/12/2010 [facility] Patient Information 2014 [place], [place].

## 2013-05-31 NOTE — Progress Notes (Signed)
Subjective:    Patient ID: Margaret Cole, female    DOB: 04/05/1990, 23 y.o.   MRN: [id number]  HPI   Margaret Cole is a very pleasant 23 yr old female here with two concerns today:  (1)  Woke up this AM with bad "pressure" in her throat - primarily RIGHT side of throat.  Has some pain as well.  Spots on tonsils.  Denies URI symptoms.  No fever.  No HA.  No GI symptoms.  Sister with allergies/cold symptoms but no other know sick contacts.  Pt has a long history of tonsillitis - was recommended to have tonsillectomy.  Actually planned to do this summer but did not feel like she had the time for recovery.  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  She is a runner, has always had trouble with left knee and occ left shin splints.  She has had persistent pain in left shin x several weeks, now with some swelling in this location as well.  She and her father have concern about possible stress fx.  She has stopped running but has not taken anything for symptoms.  She has pain when plantar- and dorsiflexing the foot.  Tenderness over the swollen spot on her leg   Review of Systems  Constitutional: Negative for fever and chills.  HENT: Positive for sore throat. Negative for congestion, ear pain, rhinorrhea and trouble swallowing.   Respiratory: Negative for cough, shortness of breath and wheezing.   Cardiovascular: Negative.   Gastrointestinal: Negative.   Musculoskeletal: Positive for arthralgias.  Skin: Negative.        Objective:   Physical Exam  Vitals reviewed. Constitutional: She is oriented to person, place, and time. She appears well-developed and well-nourished. No distress.  HENT:  Head: Normocephalic and atraumatic.  Right Ear: Tympanic membrane and ear canal normal.  Left Ear: Tympanic membrane and ear canal normal.  Mouth/Throat: Uvula is midline. Oropharyngeal exudate (scant tonsillar exudate bilaterally)  and posterior oropharyngeal edema present. No posterior oropharyngeal erythema or tonsillar abscesses.  Tonsils 3+  Eyes: Conjunctivae are normal. No scleral icterus.  Neck: Neck supple.  Cardiovascular: Normal rate, regular rhythm and normal heart sounds.   Pulmonary/Chest: Effort normal and breath sounds normal. She has no wheezes. She has no rales.  Musculoskeletal:       Left lower leg: She exhibits tenderness and swelling. She exhibits no edema, no deformity and no laceration.       Legs: TTP and mild swelling over medial aspect of anterior tibia; no edema; full ROM but increased pain with plantarflexion and dorsiflexion; distal pulses 2+, cap refill normal  Lymphadenopathy:    She has no cervical adenopathy.  Neurological: She is alert and oriented to person, place, and time.  Skin: Skin is warm and dry.  Psychiatric: She has a normal mood and affect. Her behavior is normal.    Results for orders placed in visit on 05/31/13  POCT RAPID STREP A (OFFICE)      Result Value Ref Range   Rapid Strep A Screen Negative  Negative  POCT CBC      Result Value Ref Range   WBC 5.6  4.6 - 10.2 K/uL   Lymph, poc 1.3  0.6 - 3.4   POC LYMPH PERCENT 24.1  10 - 50 %L   MID (cbc) 0.5  0 - 0.9   POC  MID % 8.1  0 - 12 %M   POC Granulocyte 3.8  2 - 6.9   Granulocyte percent 67.8  37 - 80 %G   RBC 4.12  4.04 - 5.48 M/uL   Hemoglobin 12.6  12.2 - 16.2 g/dL   HCT, POC 69.6  78.9 - 47.9 %   MCV 95.8  80 - 97 fL   MCH, POC 30.6  27 - 31.2 pg   MCHC 31.9  31.8 - 35.4 g/dL   RDW, POC 38.1     Platelet Count, POC 221  142 - 424 K/uL   MPV 8.6  0 - 99.8 fL     [facility] reading (PRIMARY) by  Dr. Cleta Alberts - negative      Assessment & Plan:  Ms. Birks is a very pleasant 23 yr old female here with two concerns 1. Acute tonsillitis History of chronic tonsillitis.  Recommended for tonsillectomy.  Tonsils are swollen bilaterally but no evidence of abscess.  Rapid strep is negative.  CBC is normal.  Will send  cx to completely r/o strep.  Suspect viral etiology.  Will treat with prednisone x 3 days to hopefully reduce swelling.  Encouraged pt to consider following through with tonsillectomy for definitive treatment  - POCT rapid strep A - Culture, Group A Strep - POCT CBC - predniSONE (DELTASONE) 50 MG tablet; Take 1 tablet (50 mg total) by mouth daily with breakfast.  Dispense: 3 tablet; Refill: 0  2. Pain and swelling of lower leg Several wks of lower leg pain.  Xray negative today, but cannot exclude possibility of stress fx.  Will start with conservative treatment - mobic daily (to start after prednisone), ice, elevation, stretching/ROM exercises.  Limit running/jumping for at least the next 2 wks.  If no improvement would refer to sports med for further eval  - DG Tibia/Fibula Left; Future - meloxicam (MOBIC) 15 MG tablet; Take 1 tablet (15 mg total) by mouth daily.  Dispense: 30 tablet; Refill: 1   Pt to call or RTC if worsening or not improving  E. Frances Furbish MHS, PA-C [facility] 5/23/20157:16 PM

## 2013-06-02 LAB — CULTURE, GROUP A STREP: Organism ID, Bacteria: NORMAL

## 2013-07-04 ENCOUNTER — Ambulatory Visit (INDEPENDENT_AMBULATORY_CARE_PROVIDER_SITE_OTHER): Payer: Managed Care, Other (non HMO) | Admitting: Obstetrics and Gynecology

## 2013-07-04 ENCOUNTER — Telehealth: Payer: Self-pay

## 2013-07-04 ENCOUNTER — Encounter: Payer: Self-pay | Admitting: Obstetrics and Gynecology

## 2013-07-04 VITALS — BP 120/76 | HR 84 | Resp 16 | Ht 66.5 in | Wt 131.4 lb

## 2013-07-04 DIAGNOSIS — Z01419 Encounter for gynecological examination (general) (routine) without abnormal findings: Secondary | ICD-10-CM

## 2013-07-04 DIAGNOSIS — Z113 Encounter for screening for infections with a predominantly sexual mode of transmission: Secondary | ICD-10-CM

## 2013-07-04 DIAGNOSIS — Z0000 Encounter for general adult medical examination without abnormal findings: Secondary | ICD-10-CM

## 2013-07-04 LAB — POCT URINALYSIS DIPSTICK
BILIRUBIN UA: NEGATIVE
Blood, UA: NEGATIVE
Glucose, UA: NEGATIVE
KETONES UA: NEGATIVE
LEUKOCYTES UA: NEGATIVE
Nitrite, UA: NEGATIVE
PH UA: 5
Protein, UA: NEGATIVE
Urobilinogen, UA: NEGATIVE

## 2013-07-04 MED ORDER — DROSPIRENONE-ETHINYL ESTRADIOL 3-0.03 MG PO TABS: 1.0000 | ORAL_TABLET | Freq: Every day | ORAL | Status: DC

## 2013-07-04 NOTE — Telephone Encounter (Signed)
[facility] on [phone] stating to call office. (pt. Seen today and GC/.CT ordered on urine but urine accidentally discarded.  Pt needs to come back by office and drop off another urine specimen)

## 2013-07-04 NOTE — Progress Notes (Signed)
Patient ID: Margaret Cole, female   DOB: 1990/07/01, 23 y.o.   MRN: [id number] GYNECOLOGY VISIT  [facility]P:  [facility]  Referring provider:   HPI: 23 y.o.   Single  Caucasian  female   G0P0 with Patient's last menstrual period was 06/28/2013.   here for   AEX. Menses predictable on Yasmin. Remembers to take every day.   Not currently sexually active.  Does use condoms.   Some headaches.   Had a bladder infection/kidney infection in May 2015. Resolved with Ciprofloxacin and possibly Macrobid.  Traveling to [place] on September 08, 2013 for 6 months.   Hgb:  12.6 on 06[facility]01-14 [facility] Urine:  Neg  GYNECOLOGIC HISTORY: Patient's last menstrual period was 06/28/2013. Sexually active:  yes Partner preference: female Contraception:  OCP's--Yasmin  Menopausal hormone therapy: n/a DES exposure:   no Blood transfusions: no   Sexually transmitted diseases:   no GYN procedures and prior surgeries:  no Last mammogram:  n/a               Last pap and high risk HPV testing: never   History of abnormal pap smear:  no   OB History   Grav Para Term Preterm Abortions TAB SAB Ect Mult Living   0                LIFESTYLE: Exercise:  Running and zumba             Tobacco: no Alcohol:    2 drinks per week Drug use:  no  OTHER HEALTH MAINTENANCE: Tetanus/TDap:  06/2011 Gardisil:             completed Influenza:           2013 Zostavax:           n/a  Bone density:     n/a Colonoscopy:   n/a  Cholesterol check: never  Family History  Problem Relation Age of Onset  . Hypertension Father   . Stroke Paternal Grandfather     Patient Active Problem List   Diagnosis Date Noted  . Recurrent tonsillitis 01/03/2013   Past Medical History  Diagnosis Date  . Hypoglycemia     No past surgical history on file.  ALLERGIES: Review of patient's allergies indicates no known allergies.  Current Outpatient Prescriptions  Medication Sig Dispense Refill  . drospirenone-ethinyl estradiol  (YASMIN,ZARAH,SYEDA) 3-0.03 MG tablet Take 1 tablet by mouth daily.  1 Package  9  . meloxicam (MOBIC) 15 MG tablet Take 1 tablet (15 mg total) by mouth daily.  30 tablet  1  . predniSONE (DELTASONE) 50 MG tablet Take 1 tablet (50 mg total) by mouth daily with breakfast.  3 tablet  0   No current facility-administered medications for this visit.     ROS:  Pertinent items are noted in HPI.  SOCIAL HISTORY:  Consulting civil engineertudent.  Single.   PHYSICAL EXAMINATION:    BP 120/76  Pulse 84  Resp 16  Ht 5' 6.5" (1.689 m)  Wt 131 lb 6.4 oz (59.603 kg)  BMI 20.89 kg/m2  LMP 06/28/2013   Wt Readings from Last 3 Encounters:  07/04/13 131 lb 6.4 oz (59.603 kg)  05/31/13 129 lb 9.6 oz (58.786 kg)  02/28/13 133 lb (60.328 kg)     Ht Readings from Last 3 Encounters:  07/04/13 5' 6.5" (1.689 m)  05/31/13 5' 6.5" (1.689 m)  01/03/13 5' 7.25" (1.708 m) (88%*, Z = 1.16)   * Growth percentiles are based  on CDC 2-20 Years data.    General appearance: alert, cooperative and appears stated age Head: Normocephalic, without obvious abnormality, atraumatic Neck: no adenopathy, supple, symmetrical, trachea midline and thyroid not enlarged, symmetric, no tenderness/mass/nodules Lungs: clear to auscultation bilaterally Breasts: Inspection negative, No nipple retraction or dimpling, No nipple discharge or bleeding, No axillary or supraclavicular adenopathy, Normal to palpation without dominant masses Heart: regular rate and rhythm Abdomen: soft, non-tender; no masses,  no organomegaly Extremities: extremities normal, atraumatic, no cyanosis or edema Skin: Skin color, texture, turgor normal. No rashes or lesions Lymph nodes: Cervical, supraclavicular, and axillary nodes normal. No abnormal inguinal nodes palpated Neurologic: Grossly normal  Pelvic: External genitalia:  no lesions              Urethra:  normal appearing urethra with no masses, tenderness or lesions              Bartholins and Skenes: normal                  Vagina: normal appearing vagina with normal color and discharge, no lesions              Cervix: normal appearance              Pap and high risk HPV testing done: No..            Bimanual Exam:  Uterus:  uterus is normal size, shape, consistency and nontender                                      Adnexa: normal adnexa in size, nontender and no masses                                       ASSESSMENT  Normal gynecologic exam.  PLAN  Pap smear and high risk HPV testing - age 23.  Counseled on self breast exam, Calcium and vitamin D intake, exercise. STD testing.  Refill on Yasmin.  See Epic for 1 pack and 1 refill.  Gave hand written Rx for 6 months to have it all filled for [place] to [place] to [place]  Needed this for insurance purposes.  When returns, will call if has run out of OCPs. Return annually or prn   An After Visit Summary was printed and given to the patient.

## 2013-07-04 NOTE — Patient Instructions (Signed)

## 2013-07-05 LAB — STD PANEL
HEP B S AG: NEGATIVE
HIV: NONREACTIVE

## 2013-07-05 LAB — HEPATITIS C ANTIBODY: HCV Ab: NEGATIVE

## 2013-07-07 ENCOUNTER — Encounter: Payer: Self-pay | Admitting: Obstetrics and Gynecology

## 2013-07-07 NOTE — Telephone Encounter (Signed)
Spoke with patient. Message given as seen below. Patient is agreeable. Nurse visit scheduled for 7/6 at 2:00pm. Patient agreeable to date and time.  CC: Claudette LawsAmanda Dixon, CMA  Routing to provider for final review. Patient agreeable to disposition. Will close encounter.

## 2013-07-07 NOTE — Telephone Encounter (Signed)
Returning a call to Amanda.

## 2013-07-14 ENCOUNTER — Ambulatory Visit (INDEPENDENT_AMBULATORY_CARE_PROVIDER_SITE_OTHER): Payer: Managed Care, Other (non HMO)

## 2013-07-14 ENCOUNTER — Telehealth: Payer: Self-pay | Admitting: Obstetrics and Gynecology

## 2013-07-14 DIAGNOSIS — Z01419 Encounter for gynecological examination (general) (routine) without abnormal findings: Secondary | ICD-10-CM

## 2013-07-14 NOTE — Telephone Encounter (Signed)
FYI

## 2013-07-14 NOTE — Telephone Encounter (Signed)
Phone call to patient. Left message to return my call.  No details left.  Patient came in today to give a urine specimen for GC/CT testing.  A nurse covering for my nurse placed the urine in the wrong container for GC/CT testing to be performed.   Patient will need to return to give another urine specimen or have cervical cultures performed if she would like to have this testing performed.   My apologies for the confusion and inconvenience for the patient.

## 2013-07-14 NOTE — Telephone Encounter (Signed)
Patient returning my call.   I explained the situation about the urine going in the wrong container.   Patient will return now to give a new urine specimen for the GC/CT.  She is not to have an office co-pay for today's visit because it was our mistake in the first place that her urine was thrown out on the day of her annual exam.

## 2013-07-14 NOTE — Telephone Encounter (Signed)
I Just wanted to be sure you both saw this as well-

## 2013-07-15 LAB — GC/CHLAMYDIA PROBE AMP, URINE
CHLAMYDIA, SWAB/URINE, PCR: NEGATIVE
GC Probe Amp, Urine: NEGATIVE

## 2013-07-15 NOTE — Telephone Encounter (Addendum)
Shanda BumpsJessica, The copay was refunded to patient before she left yesterday. It is my understanding the test was already billed too. Will you please double-check that this correct? Thank you, Mervin KungStarla

## 2013-07-15 NOTE — Telephone Encounter (Signed)
I do see a charge for a UA. From Dr. Rica RecordsSilva's note, the patient is leaving the replacement urine for a GC/CT (lab will bill that).

## 2013-09-02 ENCOUNTER — Ambulatory Visit (INDEPENDENT_AMBULATORY_CARE_PROVIDER_SITE_OTHER): Payer: Managed Care, Other (non HMO) | Admitting: Physician Assistant

## 2013-09-02 VITALS — BP 104/74 | HR 78 | Temp 98.0°F | Resp 18 | Wt 130.0 lb

## 2013-09-02 DIAGNOSIS — L237 Allergic contact dermatitis due to plants, except food: Secondary | ICD-10-CM

## 2013-09-02 DIAGNOSIS — L282 Other prurigo: Secondary | ICD-10-CM

## 2013-09-02 DIAGNOSIS — L255 Unspecified contact dermatitis due to plants, except food: Secondary | ICD-10-CM

## 2013-09-02 MED ORDER — PREDNISONE 20 MG PO TABS: ORAL_TABLET | ORAL | Status: DC

## 2013-09-02 MED ORDER — CLOBETASOL PROPIONATE 0.05 % EX CREA: 1.0000 "application " | TOPICAL_CREAM | Freq: Two times a day (BID) | CUTANEOUS | Status: DC

## 2013-09-02 NOTE — Progress Notes (Signed)
   Subjective:    Patient ID: Margaret Cole, female    DOB: July 26, 1990, 23 y.o.   MRN: [id number]  HPI 23 year old female presents for evaluation of a pruritic rash on her arms/legs. Symptoms started on 8/20 and have been progressively worsening. They continue to be intensely pruritic and new lesions continue to appear. Believes it may be poison ivy although she has never had a reaction to it in the past. She has been using an OTC anti-itch spray that does not seem to be helping.  No oral meds tried.    Review of Systems  Constitutional: Negative for fever and chills.  Skin: Positive for color change and rash.       Objective:   Physical Exam  Constitutional: She is oriented to person, place, and time. She appears well-developed and well-nourished.  HENT:  Head: Normocephalic and atraumatic.  Right Ear: External ear normal.  Left Ear: External ear normal.  Eyes: Conjunctivae are normal.  Neck: Normal range of motion.  Cardiovascular: Normal rate.   Pulmonary/Chest: Effort normal.  Neurological: She is alert and oriented to person, place, and time.  Skin:     Noted areas have erythematous papules. No drainage, blisters, or tenderness.   Psychiatric: She has a normal mood and affect. Her behavior is normal. Judgment and thought content normal.          Assessment & Plan:  Poison ivy - Plan: clobetasol cream (TEMOVATE) 0.05 %  Pruritic rash  Pruritic rash consistent with poison ivy. Will treat with topical clobetasol cream 2-3 times daily  Recommend zyrtec 10 mg daily in the morning and benadryl 25-50 mg at bedtime.  If not significantly improved in 48 hours, ok to fill rx for prednisone Follow up if symptoms worsening or fail to improve.

## 2013-09-02 NOTE — Patient Instructions (Signed)

## 2014-01-27 ENCOUNTER — Encounter: Payer: Self-pay | Admitting: Obstetrics and Gynecology

## 2014-01-27 MED ORDER — DROSPIRENONE-ETHINYL ESTRADIOL 3-0.03 MG PO TABS: 1.0000 | ORAL_TABLET | Freq: Every day | ORAL | Status: DC

## 2014-01-27 NOTE — Telephone Encounter (Signed)
Patient's last aex was 07/04/13 with Dr.Silva. Patient was traveling to [place] and was to call if she needed refills of OCP when she returned. Prescription for Yasmin #1 5RF sent to pharmacy of choice for patient. Notified patient via return mychart message as seen below.  Dear Margaret Cole,  My name is Margaret Cole. I am one of the triage nurses in the office with Dr.Silva. Your prescription for Yasmin has been sent to the [facility] at [address] [place], [place]. Refills were provided until your next annual exam which is due June 2016 with Dr.Silva. If you have any further questions or need anything feel free to give our office a call at [phone].  Margaret McalpineKaitlyn Hines, RN  Routing to provider for final review. Patient agreeable to disposition. Will close encounter

## 2014-02-09 HISTORY — PX: APPENDECTOMY: SHX54

## 2014-06-30 ENCOUNTER — Encounter: Payer: Self-pay | Admitting: Obstetrics and Gynecology

## 2014-07-01 ENCOUNTER — Other Ambulatory Visit: Payer: Self-pay | Admitting: Emergency Medicine

## 2014-07-01 NOTE — Telephone Encounter (Signed)
Chief Complaint  Patient presents with  . Medication Refill   Patient sent mychart message with refill request.  No current annual exam scheduled.  Last annual exam with Dr. Edward Jolly 07/04/13   ===View-only below this line===   ----- Message -----    From: KFMM,CRFVO    Sent: 06/30/2014  6:22 PM EDT      To: Melony Overly, MD Subject: Non-[facility]gent [facility] Question  Hi Dr. Edward Jolly,  I am working in [place], [place] and have not yet been able to make an appointment as it is difficult to get off from work right now. I will come in before I return to school in the fall, but in the mean time can you send a refill of my birth control prescription to [facility] at [address] [place], [place] please? I will certainly be coming in for an appointment before September otherwise. I hope you are doing well!  Thank you,  Margaret Cole

## 2014-07-01 NOTE — Telephone Encounter (Signed)
Telephone call for triage created to discuss message with patient and disposition as appropriate.   

## 2014-07-01 NOTE — Telephone Encounter (Signed)
Please have patient make an appointment with me for August. I will then refill 2 months of OCPs. You may then send the Rx through.

## 2014-07-02 MED ORDER — DROSPIRENONE-ETHINYL ESTRADIOL 3-0.03 MG PO TABS: 1.0000 | ORAL_TABLET | Freq: Every day | ORAL | Status: DC

## 2014-07-02 NOTE — Telephone Encounter (Signed)
Order sent for per message below from Dr. Edward Jolly.  Annual exam scheduled.   Patient notified and agreeable. Routing to provider for final review. Patient agreeable to disposition. Will close encounter.

## 2014-07-02 NOTE — Telephone Encounter (Signed)
Medication refill request:  Drospirenone-ethinyl estradiol Last AEX:  07/04/13  with BS Next AEX:  08/24/14 with BS Last MMG (if hormonal medication request): n/a Refill authorized: #2 packs/0 rfs  Patient has called and sheduled AEX for 08/24/14 with you Dr. Edward Jolly okay to send in #2 packs/0 rfs?

## 2014-07-02 NOTE — Telephone Encounter (Signed)
Patient called and scheduled for 08/24/14 with Dr. Edward Jolly for her AEX. She requests a call to confirm when the Rx is sent in please.

## 2014-07-02 NOTE — Telephone Encounter (Signed)
Pt called this morning to check status of this request. Based on Dr Rica Records response, called patient back to schedule aex. Left voicemail for patient to return call to schedule.

## 2014-07-27 ENCOUNTER — Encounter: Payer: Self-pay | Admitting: Obstetrics and Gynecology

## 2014-07-27 ENCOUNTER — Telehealth: Payer: Self-pay | Admitting: Emergency Medicine

## 2014-07-27 NOTE — Telephone Encounter (Signed)
Telephone call for triage created to discuss message with patient and disposition as appropriate.   

## 2014-07-27 NOTE — Telephone Encounter (Signed)
===  View-only below this line===   ----- Message -----    From: ZOXW,RUEAVPOPP,Najmo    Sent: 07/27/2014 11:57 AM EDT      To: [name], MD Subject: Non-Urgent Medical Question  Hi Dr. Edward JollySilva,  I just have a quick personal question. Within the past month or two, I've noticed a little bump down in that area. It's flesh colored, and hasn't really been a problem for me. Do you think I should go ahead and get it checked out, or should I just wait until my August 15th appointment with you?  Thanks,  Margaret Cole

## 2014-07-27 NOTE — Telephone Encounter (Signed)
Chief Complaint  Patient presents with  . Appointment    Patient sent mychart message that requires clinical triage   . Advice Only   Call to patient. She confirms message as below. Denies sexual partner change. Patient states she is concerned because "it could be anything." States that area is not bothering her, does not itch, no redness or drainage from site.  Advised will need office visit to evaluate and discuss and can have annual exam as planned for August.  Patient wishes to have annual that same day if she can, advised since this will be a visit related to a problem, will need separate appointment for annual exam, but can discuss with Dr. Edward JollySilva at time of appointment if necessary. Patient agreeable. Patient is out of town in [place] and requests appointment for Friday 07/31/14. Appointment scheduled.   Routing to provider for final review. Patient agreeable to disposition. Will close encounter.

## 2014-07-31 ENCOUNTER — Ambulatory Visit (INDEPENDENT_AMBULATORY_CARE_PROVIDER_SITE_OTHER): Payer: Managed Care, Other (non HMO) | Admitting: Obstetrics and Gynecology

## 2014-07-31 ENCOUNTER — Encounter: Payer: Self-pay | Admitting: Obstetrics and Gynecology

## 2014-07-31 VITALS — BP 102/78 | HR 100 | Ht 66.5 in | Wt 132.0 lb

## 2014-07-31 DIAGNOSIS — Z113 Encounter for screening for infections with a predominantly sexual mode of transmission: Secondary | ICD-10-CM | POA: Diagnosis not present

## 2014-07-31 DIAGNOSIS — Z01419 Encounter for gynecological examination (general) (routine) without abnormal findings: Secondary | ICD-10-CM | POA: Diagnosis not present

## 2014-07-31 DIAGNOSIS — B081 Molluscum contagiosum: Secondary | ICD-10-CM | POA: Diagnosis not present

## 2014-07-31 DIAGNOSIS — Z0000 Encounter for general adult medical examination without abnormal findings: Secondary | ICD-10-CM

## 2014-07-31 LAB — POCT URINALYSIS DIPSTICK
Bilirubin, UA: NEGATIVE
GLUCOSE UA: NEGATIVE
KETONES UA: NEGATIVE
Leukocytes, UA: NEGATIVE
Nitrite, UA: NEGATIVE
Protein, UA: NEGATIVE
UROBILINOGEN UA: NEGATIVE
pH, UA: 6

## 2014-07-31 MED ORDER — IMIQUIMOD 5 % EX CREA: TOPICAL_CREAM | CUTANEOUS | Status: DC

## 2014-07-31 MED ORDER — DROSPIRENONE-ETHINYL ESTRADIOL 3-0.03 MG PO TABS: 1.0000 | ORAL_TABLET | Freq: Every day | ORAL | Status: DC

## 2014-07-31 MED ORDER — ESTRADIOL 0.05 MG/24HR TD PTTW: MEDICATED_PATCH | TRANSDERMAL | Status: DC
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
## 2014-07-31 NOTE — Patient Instructions (Addendum)
EXERCISE AND DIET:  We recommended that you start or continue a regular exercise program for good health. Regular exercise means any activity that makes your heart beat faster and makes you sweat.  We recommend exercising at least 30 minutes per day at least 3 days a week, preferably 4 or 5.  We also recommend a diet low in fat and sugar.  Inactivity, poor dietary choices and obesity can cause diabetes, heart attack, stroke, and kidney damage, among others.    ALCOHOL AND SMOKING:  Women should limit their alcohol intake to no more than 7 drinks/beers/glasses of wine (combined, not each!) per week. Moderation of alcohol intake to this level decreases your risk of breast cancer and liver damage. And of course, no recreational drugs are part of a healthy lifestyle.  And absolutely no smoking or even second hand smoke. Most people know smoking can cause heart and lung diseases, but did you know it also contributes to weakening of your bones? Aging of your skin?  Yellowing of your teeth and nails?  CALCIUM AND VITAMIN D:  Adequate intake of calcium and Vitamin D are recommended.  The recommendations for exact amounts of these supplements seem to change often, but generally speaking 600 mg of calcium (either carbonate or citrate) and 800 units of Vitamin D per day seems prudent. Certain women may benefit from higher intake of Vitamin D.  If you are among these women, your doctor will have told you during your visit.    PAP SMEARS:  Pap smears, to check for cervical cancer or precancers,  have traditionally been done yearly, although recent scientific advances have shown that most women can have pap smears less often.  However, every woman still should have a physical exam from her gynecologist every year. It will include a breast check, inspection of the vulva and vagina to check for abnormal growths or skin changes, a visual exam of the cervix, and then an exam to evaluate the size and shape of the uterus and  ovaries.  And after 22 years of age, a rectal exam is indicated to check for rectal cancers. We will also provide age appropriate advice regarding health maintenance, like when you should have certain vaccines, screening for sexually transmitted diseases, bone density testing, colonoscopy, mammograms, etc.   MAMMOGRAMS:  All women over 40 years old should have a yearly mammogram. Many facilities now offer a "3D" mammogram, which may cost around $50 extra out of pocket. If possible,  we recommend you accept the option to have the 3D mammogram performed.  It both reduces the number of women who will be called back for extra views which then turn out to be normal, and it is better than the routine mammogram at detecting truly abnormal areas.    COLONOSCOPY:  Colonoscopy to screen for colon cancer is recommended for all women at age 50.  We know, you hate the idea of the prep.  We agree, BUT, having colon cancer and not knowing it is worse!!  Colon cancer so often starts as a polyp that can be seen and removed at colonscopy, which can quite literally save your life!  And if your first colonoscopy is normal and you have no family history of colon cancer, most women don't have to have it again for 10 years.  Once every ten years, you can do something that may end up saving your life, right?  We will be happy to help you get it scheduled when you are ready.    Be sure to check your insurance coverage so you understand how much it will cost.  It may be covered as a preventative service at no cost, but you should check your particular policy.     Molluscum Contagiosum Molluscum contagiosum is a viral infection of the skin that causes smooth surfaced, firm, small (3 to 5 mm), dome-shaped bumps (papules) which are flesh-colored. The bumps usually do not hurt or itch. In children, they most often appear on the face, trunk, arms and legs. In adults, the growths are commonly found on the genitals, thighs, face, neck, and  belly (abdomen). The infection may be spread to others by close (skin to skin) contact (such as occurs in schools and swimming pools), sharing towels and clothing, and through sexual contact. The bumps usually disappear without treatment in 2 to 4 months, especially in children. You may have them treated to avoid spreading them. Scraping (curetting) the middle part (central plug) of the bump with a needle or sharp curette, or application of liquid nitrogen for 8 or 9 seconds usually cures the infection. HOME CARE INSTRUCTIONS   Do not scratch the bumps. This may spread the infection to other parts of the body and to other people.  Avoid close contact with others, including sexual contact, until the bumps disappear. Do not share towels or clothing.  If liquid nitrogen was used, blisters will form. Leave the blisters alone and cover with a bandage. The tops will fall off by themselves in 7 to 14 days.  Four months without a lesion is usually a cure. SEEK IMMEDIATE MEDICAL CARE IF:  You have a fever.  You develop swelling, redness, pain, tenderness, or warmth in the areas of the bumps. They may be infected. Document Released: 12/24/1999 Document Revised: 03/20/2011 Document Reviewed: 06/05/2008 [facility] Patient Information 2015 [place], [place]. This information is not intended to replace advice given to you by your health care provider. Make sure you discuss any questions you have with your health care provider.  Imiquimod skin cream What is this medicine? IMIQUIMOD (i mi KWI mod) cream is used to treat external genital or anal warts. It is also used to treat other skin conditions such as actinic keratosis and certain types of skin cancer. This medicine may be used for other purposes; ask your health care provider or pharmacist if you have questions. COMMON BRAND NAME(S): Celesta Aver What should I tell my health care provider before I take this medicine? They need to know if you have any of  these conditions: -decreased immune function -an unusual or allergic reaction to imiquimod, other medicines, foods, dyes, or preservatives -pregnant or trying to get pregnant -breast-feeding How should I use this medicine? This medicine is for external use only. Do not take by mouth. Follow the directions on the prescription label. Apply just before bedtime. Wash your hands before and after use. Apply a thin layer of cream and massage gently into the affected areas until no longer visible. Do not use in the mouth, eyes or the vagina. Use this medicine only on the affected area as directed by your health care provider. Do not use for longer than prescribed. It is important not to use more medicine than prescribed. To do so may increase the chance of side effects. Talk to your pediatrician regarding the use of this medicine in children. While this drug may be prescribed for children as young as 2 years of age for selected conditions, precautions do apply. Overdosage: If you think you have  taken too much of this medicine contact a poison control center or emergency room at once. NOTE: This medicine is only for you. Do not share this medicine with others. What if I miss a dose? If you miss a dose, use it as soon as you can. If it is almost time for your next dose, use only that dose. Do not use double or extra doses. What may interact with this medicine? Interactions are not expected. Do not use any other medicines on the treated area without asking your doctor or health care professional. This list may not describe all possible interactions. Give your health care provider a list of all the medicines, herbs, non-prescription drugs, or dietary supplements you use. Also tell them if you smoke, drink alcohol, or use illegal drugs. Some items may interact with your medicine. What should I watch for while using this medicine? Visit your health care professional for regular checks on your progress. Do not  use this medicine until the skin has healed from any other drug (example: podofilox or podophyllin resin) or surgical skin treatment. Females should receive regular pelvic exams while being treated for genital warts. Most patients see improvement within 4 weeks. It may take up to 16 weeks to see a full clearing of the warts. This medicine is not a cure. New warts may develop during or after treatment. Avoid sexual (genital, anal, oral) contact while the cream is on the skin. If warts are visible in the genital area, sexual contact should be avoided until the warts are treated. The use of latex condoms during sexual contact may reduce, but not entirely prevent, infecting others. This medicine may weaken condoms, diaphragms, cervical caps or other barrier devices and make them less effective as birth control. Do not cover the treated area with an airtight bandage. Cotton gauze dressings can be used. Cotton underwear can be worn after using this medicine on the genital or anal area. Actinic keratoses that were not seen before may appear during treatment and may later go away. The treatment area and surrounding area may lighten or darken after treatment with this medicine. These skin color changes may be permanent in some patients. If you experience a skin reaction at the treatment site that interferes or prevents you from doing any daily activity, contact your health care provider. You may need a rest period from treatment. Treatment may be restarted once the reaction has gotten better as recommended by your doctor or health care professional. This medicine can make you more sensitive to the sun. Keep out of the sun. If you cannot avoid being in the sun, wear protective clothing and use sunscreen. Do not use sun lamps or tanning beds/booths. What side effects may I notice from receiving this medicine? Side effects that you should report to your doctor or health care professional as soon as possible: -open sores  with or without drainage -skin infection -skin rash -unusual or severe skin reaction Side effects that usually do not require medical attention (report to your doctor or health care professional if they continue or are bothersome): -burning or itching -redness of the skin (very common but is usually not painful or harmful) -scabbing, crusting, or peeling skin -skin that becomes hard or thickened -swelling of the skin This list may not describe all possible side effects. Call your doctor for medical advice about side effects. You may report side effects to FDA at 1-800-FDA-1088. Where should I keep my medicine? Keep out of the reach of children. Store  between 4 and 25 degrees C (39 and 77 degrees F). Do not freeze. Throw away any unused medicine after the expiration date. Discard packet after applying to affected area. Partial packets should not be saved or reused. NOTE: This sheet is a summary. It may not cover all possible information. If you have questions about this medicine, talk to your doctor, pharmacist, or health care provider.  2015, Elsevier/Gold Standard. (2007-12-10 10:33:25)

## 2014-08-01 LAB — STD PANEL
HIV: NONREACTIVE
Hepatitis B Surface Ag: NEGATIVE

## 2014-08-01 LAB — HEPATITIS C ANTIBODY: HCV Ab: NEGATIVE

## 2014-08-03 LAB — GC/CHLAMYDIA PROBE AMP, URINE
CHLAMYDIA, SWAB/URINE, PCR: NEGATIVE
GC Probe Amp, Urine: NEGATIVE

## 2014-08-04 LAB — IPS PAP TEST WITH REFLEX TO HPV

## 2014-08-20 ENCOUNTER — Telehealth: Payer: Self-pay

## 2014-08-20 ENCOUNTER — Encounter: Payer: Self-pay | Admitting: Obstetrics and Gynecology

## 2014-08-20 NOTE — Telephone Encounter (Signed)
Telephone call encounter created to speak with patient regarding mychart message. Please see note dated 08/20/2014.

## 2014-08-20 NOTE — Telephone Encounter (Signed)
Non-Urgent Medical Question  Message [phone]   From  [name]   To  [name], MD   Sent  08/20/2014 9:06 AM     Hi Dr. Edward Jolly,   It seemed as though the Aldara was working for a bit, but in the last few days it's gotten worse in that area. Some of the spots are swollen and bigger than the original one, which had almost disappeared before it all flared up. What do you recommend I do?   Thanks,   Margaret Cole      Responsible Party    Pool - [facility] No one has taken responsibility for this message.     No actions have been taken on this message.

## 2014-08-20 NOTE — Telephone Encounter (Signed)
Spoke with patient. Advised of message as seen below from Dr.Silva. Patient prefers not to be seen at urgent care. Would like to schedule an appointment for 08/24/2014 with Dr.Silva. Appointment scheduled for 8/15/206 at 1pm with Dr.Silva. Advised if symptoms worsen will need to be seen at local urgent care tomorrow or over the weekend. Patient is agreeable and verbalizes understanding.  Routing to provider for final review. Patient agreeable to disposition. Will close encounter.   Patient aware provider will review message and nurse will return call if any additional advice or change of disposition.

## 2014-08-20 NOTE — Telephone Encounter (Signed)
Patient lives so far away.  How about she see urgent care in [place]?

## 2014-08-20 NOTE — Telephone Encounter (Signed)
Spoke with patient regarding mychart message as seen below. Was last seen on 07/31/2014 for aex with Dr.Silva.Was prescribed Aldara cream for vaginal lumps. Patient states that she has been using the Aldara cream 3 nights a week. Felt the vaginal bumps were getting better until yesterday. Noticed yesterday the areas were getting larger in size, became uncomfortable to sit on, and burn with urination. Patient applied Aldara cream last night with little relief. Patient states she not has 5-7 bumps and originally had 3. Patient lives out of town and is asking what she should do. Is not available to be seen until Monday 8/15. Advised will speak with Dr.Silva and return call with further recommendations. Patient is agreeable.

## 2014-08-24 ENCOUNTER — Encounter: Payer: Self-pay | Admitting: Obstetrics and Gynecology

## 2014-08-24 ENCOUNTER — Ambulatory Visit (INDEPENDENT_AMBULATORY_CARE_PROVIDER_SITE_OTHER): Payer: Managed Care, Other (non HMO) | Admitting: Obstetrics and Gynecology

## 2014-08-24 ENCOUNTER — Ambulatory Visit: Payer: Managed Care, Other (non HMO) | Admitting: Obstetrics and Gynecology

## 2014-08-24 VITALS — BP 110/76 | HR 80 | Ht 66.5 in | Wt 128.8 lb

## 2014-08-24 DIAGNOSIS — N9089 Other specified noninflammatory disorders of vulva and perineum: Secondary | ICD-10-CM | POA: Diagnosis not present

## 2014-08-24 NOTE — Progress Notes (Signed)
Patient ID: Margaret Cole, female   DOB: September 06, 1990, 24 y.o.   MRN: [id number] GYNECOLOGY  VISIT   HPI: 24 y.o.   Single  Caucasian  female   G0P0 with Patient's last menstrual period was 08/23/2014.   here for follow up to vulvar lesions. Increased symptoms last week of vulvar lesions and pain.   Patient was seen in an Urgent Care Center on Friday and treated for a possible bacterial infection with Bactroban ointment and symptoms have improved.  She was even told she may have herpes but wasn't tested for this. Now the lesions are significantly reduced.  The area were painful to sit but is not painful now.  Not given antiviral medication.  Using Aldara up until last week.   Denies flu like symptoms or fever.   No new partners.  Partner does not have molluscum. No know HSV I infection for patient or partner.   Estrogen patch is helping with menstrual headaches.  No further headaches.   GYNECOLOGIC HISTORY: Patient's last menstrual period was 08/23/2014. Contraception:OCPs--Yasmin Menopausal hormone therapy: Minivelle 0.05mg  Last mammogram: n/a Last pap smear: 07-31-14 Neg        OB History    Gravida Para Term Preterm AB TAB SAB Ectopic Multiple Living   0                  Patient Active Problem List   Diagnosis Date Noted  . 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 Hypoglycemia     Past Surgical History  Procedure Laterality Date  . Appendectomy  02/2014    [place], [place]    Current Outpatient Prescriptions  Medication Sig Dispense Refill  . drospirenone-ethinyl estradiol (YASMIN,ZARAH,SYEDA) 3-0.03 MG tablet Take 1 tablet by mouth daily. 1 Package 11  . estradiol (MINIVELLE) 0.05 MG/24HR patch Apply anywhere on lower abdomen.  Change patch on Monday and Thursday during your menstrual week. 4 patch 11  . imiquimod (ALDARA) 5 % cream Apply topically 3 (three) times a week. 12 each 2  . mupirocin ointment (BACTROBAN) 2 % Apply 1  application topically 3 (three) times daily.  0   No current facility-administered medications for this visit.     ALLERGIES: Review of patient's allergies indicates no known allergies.  Family History  Problem Relation Age of Onset  . Hypertension Father   . Heart disease Father   . Stroke Paternal Grandfather     Social History   Social History  . Marital Status: Single    Spouse Name: N/A  . Number of Children: N/A  . Years of Education: N/A   Occupational History  . Not on file.   Social History Main Topics  . Smoking status: Never Smoker   . Smokeless tobacco: Never Used  . Alcohol Use: 0.6 - 1.2 oz/week    1-2 Standard drinks or equivalent per week     Comment: 1-2 beers a week  . Drug Use: No  . Sexual Activity:    Partners: Male    Birth Control/ Protection: None, Pill     Comment: Yasmin   Other Topics Concern  . Not on file   Social History Narrative    ROS:  Pertinent items are noted in HPI.  PHYSICAL EXAMINATION:    BP 110/76 mmHg  Pulse 80  Ht 5' 6.5" (1.689 m)  Wt 128 lb 12.8 oz (58.423 kg)  BMI 20.48 kg/m2  LMP 08/23/2014    General appearance: alert, cooperative  and appears stated age   No abnormal inguinal nodes palpated    Pelvic: External genitalia:   Satellite lesions of bilateral inferior labia majora.  No true ulceration.  No drainage.               Urethra:  normal appearing urethra with no masses, tenderness or lesions              Bartholins and Skenes: normal                 Vagina:  Tampon in place.              Cervix:  Not examined.               Chaperone was present for exam.  ASSESSMENT  Vulvar lesions.  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   No molluscum lesions.  Recent Aldara use.  Stopped.  Menstrual headaches resolved on estrgen patch.   PLAN  Counseled regarding HSV infections.  Will do herpes culture and do testing for HSV I and II IgM and IgG. No antiviral medication at this time.  Stop Aldara.   An After  Visit Summary was printed and given to the patient.  _15_____ minutes face to face time of which over 50% was spent in counseling.

## 2014-08-25 LAB — HSV(HERPES SIMPLEX VRS) I + II AB-IGG: HSV 2 Glycoprotein G Ab, IgG: <0.1 IV

## 2014-08-26 LAB — HERPES SIMPLEX VIRUS CULTURE: ORGANISM ID, BACTERIA: NOT DETECTED

## 2014-08-28 LAB — HSV 1 AND 2 IGM ABS, INDIRECT
HSV 1 IgM Abs: POSITIVE — AB
HSV 2 IgM Abs: NEGATIVE

## 2014-08-28 LAB — HSV 1 IGM, IFA (REFLEX)

## 2014-08-30 ENCOUNTER — Encounter: Payer: Self-pay | Admitting: Obstetrics and Gynecology

## 2014-08-31 ENCOUNTER — Telehealth: Payer: Self-pay | Admitting: Obstetrics and Gynecology

## 2014-08-31 NOTE — Telephone Encounter (Signed)
Patient wants to talk with Dr. Edward Jolly. She states she will know what she is calling about.

## 2014-08-31 NOTE — Telephone Encounter (Signed)
Spoke with patient. Patient states she has questions regarding her HSV results. Results seen below via mychart message from Dr.Silva. 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 Advised testing does not provide [place] with the exact date, but that it has been recently. Patient is agreeable. Patient is asking if she does not have a recurrence what does that mean. Advised if no recurrence occurs may have been an exposure to HSV 1 genitally. Advised HSV 1 is most common for fever blisters, but can appear vaginally if she has had exposure in the area. Advised if she ever has a recurrence will need to be seen in office for further testing as areas may have been too dry to accurately test for HSV II when she was last seen. Patient is agreeable. Asking if she can have children with HSV 1. Advised she may still have children. Patient is agreeable and will return call with any further questions.  Entered by [name], MD at 08/30/2014 11:23 AM     Read by Rosario Jacks at 08/30/2014 11:28 PM    Hi Margaret Cole,   I have your final test results back. The culture of the genital area was negative for herpes infection.    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 This is the type that causes fever blisters and can also affect the genital region.  The testing for usual genital herpes, herpes type II, was negative.   At this point it is important to use condoms with all sexual contact and report to me if you have any recurrences of these lesions on your genital area. If there are recurrences, we will think that this may really be a herpes infection and that the culture did not capture the organisms because the areas were already healed over and not expressing much viral material. Recurrent sores can be recultured to be more clear and certain.   Please call the office for questions.   Thank you,   Conley Simmonds, MD     Routing to provider for final review.  Patient agreeable to disposition. Will close encounter.   Patient aware provider will review message and nurse will return call if any additional advice or change of disposition.

## 2014-08-31 NOTE — Telephone Encounter (Signed)
Left message to call [name] at [phone]. 

## 2015-07-14 ENCOUNTER — Other Ambulatory Visit: Payer: Self-pay | Admitting: Obstetrics and Gynecology

## 2015-07-14 NOTE — Telephone Encounter (Signed)
Medication refill request: OCP Last AEX:  07-31-14 Next AEX: 08-02-15 Last MMG (if hormonal medication request): N/A Refill authorized: please advise

## 2015-07-19 ENCOUNTER — Other Ambulatory Visit: Payer: Self-pay | Admitting: Obstetrics and Gynecology

## 2015-07-19 NOTE — Telephone Encounter (Signed)
Patient is calling requesting a refill of her birth control. Confirmed pharmacy on file. Patient said the pharmacy told her they did not get a response from the previous request.

## 2015-07-19 NOTE — Telephone Encounter (Signed)
Left message letting patient know that her OCP was sent into the [facility] on [address]. Patient is to call back if there is a problem-eh

## 2015-08-02 ENCOUNTER — Encounter: Payer: Self-pay | Admitting: Obstetrics and Gynecology

## 2015-08-02 ENCOUNTER — Ambulatory Visit (INDEPENDENT_AMBULATORY_CARE_PROVIDER_SITE_OTHER): Payer: Managed Care, Other (non HMO) | Admitting: Obstetrics and Gynecology

## 2015-08-02 VITALS — BP 100/66 | HR 80 | Resp 16 | Ht 66.5 in | Wt 125.2 lb

## 2015-08-02 DIAGNOSIS — Z01419 Encounter for gynecological examination (general) (routine) without abnormal findings: Secondary | ICD-10-CM

## 2015-08-02 DIAGNOSIS — R51 Headache: Secondary | ICD-10-CM | POA: Diagnosis not present

## 2015-08-02 DIAGNOSIS — Z0000 Encounter for general adult medical examination without abnormal findings: Secondary | ICD-10-CM | POA: Diagnosis not present

## 2015-08-02 DIAGNOSIS — R519 Headache, unspecified: Secondary | ICD-10-CM

## 2015-08-02 LAB — COMPREHENSIVE METABOLIC PANEL
ALK PHOS: 65 U/L (ref 33–115)
ALT: 8 U/L (ref 6–29)
AST: 14 U/L (ref 10–30)
Albumin: 4.3 g/dL (ref 3.6–5.1)
BUN: 10 mg/dL (ref 7–25)
CO2: 25 mmol/L (ref 20–31)
CREATININE: 0.71 mg/dL (ref 0.50–1.10)
Calcium: 9.4 mg/dL (ref 8.6–10.2)
Chloride: 105 mmol/L (ref 98–110)
Glucose, Bld: 86 mg/dL (ref 65–99)
Potassium: 4.3 mmol/L (ref 3.5–5.3)
SODIUM: 137 mmol/L (ref 135–146)
TOTAL PROTEIN: 6.5 g/dL (ref 6.1–8.1)
Total Bilirubin: 0.5 mg/dL (ref 0.2–1.2)

## 2015-08-02 LAB — POCT URINALYSIS DIPSTICK
BILIRUBIN UA: NEGATIVE
Blood, UA: NEGATIVE
GLUCOSE UA: NEGATIVE
KETONES UA: NEGATIVE
LEUKOCYTES UA: NEGATIVE
Nitrite, UA: NEGATIVE
Protein, UA: NEGATIVE
Urobilinogen, UA: NEGATIVE
pH, UA: 5

## 2015-08-02 LAB — CBC
HCT: 39.2 % (ref 35.0–45.0)
Hemoglobin: 13.6 g/dL (ref 11.7–15.5)
MCH: 30.7 pg (ref 27.0–33.0)
MCHC: 34.7 g/dL (ref 32.0–36.0)
MCV: 88.5 fL (ref 80.0–100.0)
MPV: 9.7 fL (ref 7.5–12.5)
PLATELETS: 250 10*3/uL (ref 140–400)
RBC: 4.43 MIL/uL (ref 3.80–5.10)
RDW: 12.6 % (ref 11.0–15.0)
WBC: 7 10*3/uL (ref 3.8–10.8)

## 2015-08-02 LAB — LIPID PANEL
CHOL/HDL RATIO: 2.3 ratio (ref <=5.0)
Cholesterol: 153 mg/dL (ref 125–200)
HDL: 66 mg/dL (ref >=46)
LDL CALC: 56 mg/dL (ref <130)
Triglycerides: 153 mg/dL — ABNORMAL HIGH (ref <150)
VLDL: 31 mg/dL — ABNORMAL HIGH (ref <30)

## 2015-08-02 MED ORDER — DROSPIRENONE-ETHINYL ESTRADIOL 3-0.03 MG PO TABS: 1.0000 | ORAL_TABLET | Freq: Every day | ORAL | 11 refills | Status: DC

## 2015-08-02 MED ORDER — ESTRADIOL 0.05 MG/24HR TD PTTW: MEDICATED_PATCH | TRANSDERMAL | 11 refills | Status: DC

## 2015-08-02 NOTE — Patient Instructions (Signed)
Health Maintenance, Female Adopting a healthy lifestyle and getting preventive care can go a long way to promote health and wellness. Talk with your health care provider about what schedule of regular examinations is right for you. This is a good chance for you to check in with your provider about disease prevention and staying healthy. In between checkups, there are plenty of things you can do on your own. Experts have done a lot of research about which lifestyle changes and preventive measures are most likely to keep you healthy. Ask your health care provider for more information. WEIGHT AND DIET  Eat a healthy diet  Be sure to include plenty of vegetables, fruits, low-fat dairy products, and lean protein.  Do not eat a lot of foods high in solid fats, added sugars, or salt.  Get regular exercise. This is one of the most important things you can do for your health.  Most adults should exercise for at least 150 minutes each week. The exercise should increase your heart rate and make you sweat (moderate-intensity exercise).  Most adults should also do strengthening exercises at least twice a week. This is in addition to the moderate-intensity exercise.  Maintain a healthy weight  Body mass index (BMI) is a measurement that can be used to identify possible weight problems. It estimates body fat based on height and weight. Your health care provider can help determine your BMI and help you achieve or maintain a healthy weight.  For females 20 years of age and older:   A BMI below 18.5 is considered underweight.  A BMI of 18.5 to 24.9 is normal.  A BMI of 25 to 29.9 is considered overweight.  A BMI of 30 and above is considered obese.  Watch levels of cholesterol and blood lipids  You should start having your blood tested for lipids and cholesterol at 23 years of age, then have this test every 5 years.  You may need to have your cholesterol levels checked more often if:  Your lipid  or cholesterol levels are high.  You are older than 23 years of age.  You are at high risk for heart disease.  CANCER SCREENING   Lung Cancer  Lung cancer screening is recommended for adults 55-80 years old who are at high risk for lung cancer because of a history of smoking.  A yearly low-dose CT scan of the lungs is recommended for people who:  Currently smoke.  Have quit within the past 15 years.  Have at least a 30-pack-year history of smoking. A pack year is smoking an average of one pack of cigarettes a day for 1 year.  Yearly screening should continue until it has been 15 years since you quit.  Yearly screening should stop if you develop a health problem that would prevent you from having lung cancer treatment.  Breast Cancer  Practice breast self-awareness. This means understanding how your breasts normally appear and feel.  It also means doing regular breast self-exams. Let your health care provider know about any changes, no matter how small.  If you are in your 20s or 30s, you should have a clinical breast exam (CBE) by a health care provider every 1-3 years as part of a regular health exam.  If you are 40 or older, have a CBE every year. Also consider having a breast X-ray (mammogram) every year.  If you have a family history of breast cancer, talk to your health care provider about genetic screening.  If you   are at high risk for breast cancer, talk to your health care provider about having an MRI and a mammogram every year.  Breast cancer gene (BRCA) assessment is recommended for women who have family members with BRCA-related cancers. BRCA-related cancers include:  Breast.  Ovarian.  Tubal.  Peritoneal cancers.  Results of the assessment will determine the need for genetic counseling and BRCA1 and BRCA2 testing. Cervical Cancer Your health care provider may recommend that you be screened regularly for cancer of the pelvic organs (ovaries, uterus, and  vagina). This screening involves a pelvic examination, including checking for microscopic changes to the surface of your cervix (Pap test). You may be encouraged to have this screening done every 3 years, beginning at age 21.  For women ages 30-65, health care providers may recommend pelvic exams and Pap testing every 3 years, or they may recommend the Pap and pelvic exam, combined with testing for human papilloma virus (HPV), every 5 years. Some types of HPV increase your risk of cervical cancer. Testing for HPV may also be done on women of any age with unclear Pap test results.  Other health care providers may not recommend any screening for nonpregnant women who are considered low risk for pelvic cancer and who do not have symptoms. Ask your health care provider if a screening pelvic exam is right for you.  If you have had past treatment for cervical cancer or a condition that could lead to cancer, you need Pap tests and screening for cancer for at least 20 years after your treatment. If Pap tests have been discontinued, your risk factors (such as having a new sexual partner) need to be reassessed to determine if screening should resume. Some women have medical problems that increase the chance of getting cervical cancer. In these cases, your health care provider may recommend more frequent screening and Pap tests. Colorectal Cancer  This type of cancer can be detected and often prevented.  Routine colorectal cancer screening usually begins at 23 years of age and continues through 23 years of age.  Your health care provider may recommend screening at an earlier age if you have risk factors for colon cancer.  Your health care provider may also recommend using home test kits to check for hidden blood in the stool.  A small camera at the end of a tube can be used to examine your colon directly (sigmoidoscopy or colonoscopy). This is done to check for the earliest forms of colorectal  cancer.  Routine screening usually begins at age 50.  Direct examination of the colon should be repeated every 5-10 years through 23 years of age. However, you may need to be screened more often if early forms of precancerous polyps or small growths are found. Skin Cancer  Check your skin from head to toe regularly.  Tell your health care provider about any new moles or changes in moles, especially if there is a change in a mole's shape or color.  Also tell your health care provider if you have a mole that is larger than the size of a pencil eraser.  Always use sunscreen. Apply sunscreen liberally and repeatedly throughout the day.  Protect yourself by wearing long sleeves, pants, a wide-brimmed hat, and sunglasses whenever you are outside. HEART DISEASE, DIABETES, AND HIGH BLOOD PRESSURE   High blood pressure causes heart disease and increases the risk of stroke. High blood pressure is more likely to develop in:  People who have blood pressure in the high end   of the normal range (130-139/85-89 mm Hg).  People who are overweight or obese.  People who are African American.  If you are 38-23 years of age, have your blood pressure checked every 3-5 years. If you are 61 years of age or older, have your blood pressure checked every year. You should have your blood pressure measured twice--once when you are at a hospital or clinic, and once when you are not at a hospital or clinic. Record the average of the two measurements. To check your blood pressure when you are not at a hospital or clinic, you can use:  An automated blood pressure machine at a pharmacy.  A home blood pressure monitor.  If you are between 45 years and 39 years old, ask your health care provider if you should take aspirin to prevent strokes.  Have regular diabetes screenings. This involves taking a blood sample to check your fasting blood sugar level.  If you are at a normal weight and have a low risk for diabetes,  have this test once every three years after 23 years of age.  If you are overweight and have a high risk for diabetes, consider being tested at a younger age or more often. PREVENTING INFECTION  Hepatitis B  If you have a higher risk for hepatitis B, you should be screened for this virus. You are considered at high risk for hepatitis B if:  You were born in a country where hepatitis B is common. Ask your health care provider which countries are considered high risk.  Your parents were born in a high-risk country, and you have not been immunized against hepatitis B (hepatitis B vaccine).  You have HIV or AIDS.  You use needles to inject street drugs.  You live with someone who has hepatitis B.  You have had sex with someone who has hepatitis B.  You get hemodialysis treatment.  You take certain medicines for conditions, including cancer, organ transplantation, and autoimmune conditions. Hepatitis C  Blood testing is recommended for:  Everyone born from [birth year] through 1963.  Anyone with known risk factors for hepatitis C. Sexually transmitted infections (STIs)  You should be screened for sexually transmitted infections (STIs) including gonorrhea and chlamydia if:  You are sexually active and are younger than 24 years of age.  You are older than 23 years of age and your health care provider tells you that you are at risk for this type of infection.  Your sexual activity has changed since you were last screened and you are at an increased risk for chlamydia or gonorrhea. Ask your health care provider if you are at risk.  If you do not have HIV, but are at risk, it may be recommended that you take a prescription medicine daily to prevent HIV infection. This is called pre-exposure prophylaxis (PrEP). You are considered at risk if:  You are sexually active and do not regularly use condoms or know the HIV status of your partner(s).  You take drugs by injection.  You are sexually  active with a partner who has HIV. Talk with your health care provider about whether you are at high risk of being infected with HIV. If you choose to begin PrEP, you should first be tested for HIV. You should then be tested every 3 months for as long as you are taking PrEP.  PREGNANCY   If you are premenopausal and you may become pregnant, ask your health care provider about preconception counseling.  If you may  become pregnant, take 400 to 800 micrograms (mcg) of folic acid every day.  If you want to prevent pregnancy, talk to your health care provider about birth control (contraception). OSTEOPOROSIS AND MENOPAUSE   Osteoporosis is a disease in which the bones lose minerals and strength with aging. This can result in serious bone fractures. Your risk for osteoporosis can be identified using a bone density scan.  If you are 61 years of age or older, or if you are at risk for osteoporosis and fractures, ask your health care provider if you should be screened.  Ask your health care provider whether you should take a calcium or vitamin D supplement to lower your risk for osteoporosis.  Menopause may have certain physical symptoms and risks.  Hormone replacement therapy may reduce some of these symptoms and risks. Talk to your health care provider about whether hormone replacement therapy is right for you.  HOME CARE INSTRUCTIONS   Schedule regular health, dental, and eye exams.  Stay current with your immunizations.   Do not use any tobacco products including cigarettes, chewing tobacco, or electronic cigarettes.  If you are pregnant, do not drink alcohol.  If you are breastfeeding, limit how much and how often you drink alcohol.  Limit alcohol intake to no more than 1 drink per day for nonpregnant women. One drink equals 12 ounces of beer, 5 ounces of wine, or 1 ounces of hard liquor.  Do not use street drugs.  Do not share needles.  Ask your health care provider for help if  you need support or information about quitting drugs.  Tell your health care provider if you often feel depressed.  Tell your health care provider if you have ever been abused or do not feel safe at home.   This information is not intended to replace advice given to you by your health care provider. Make sure you discuss any questions you have with your health care provider.   Document Released: 07/11/2010 Document Revised: 01/16/2014 Document Reviewed: 11/27/2012 Elsevier Interactive Patient Education [employer].

## 2015-08-02 NOTE — Progress Notes (Signed)
25 y.o. G71P0 Single Caucasian female here for annual exam.    On Zarah and Minivelle patch for menstrual headaches.  Has menses once a month. Cramps are adequately controlled. Headaches are more all the time.  Takes ibuprofen or tylenol to control them.  No nausea or photophobia.  No aura.  Occurs about 1/2 of the days in a month.   Frequent urination.   Right breast sensitivity prior to her menses and then occurred again and persists for 2 weeks.  No masses felt by patient.   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   Steady partner for 2 years.  Declines STD testing.   PCP:  None   Patient's last menstrual period was 07/18/2015 (exact date).     Period Cycle (Days): 304 Period Pattern: Regular Menstrual Flow:  (heavy first day then light) Menstrual Control: Tampon Menstrual Control Change Freq (Hours): every 3-4 hours on heaviest day Dysmenorrhea: (!) Moderate Dysmenorrhea Symptoms: Cramping, Headache (loose stools with cycles but not true diarrhea; and headaches)     Sexually active: Yes.   female The current method of family planning is OCP (estrogen/progesterone)--Zarah.    Exercising: Yes.    Yoga and walking. Smoker:  no  Health Maintenance: Pap:  07-31-14 Neg History of abnormal Pap:  no MMG:  n/a Colonoscopy:  n/a BMD:   n/a  Result  n/a TDaP:  06/2011 Gardasil:   yes Screening Labs:  Urine today: Neg   reports that she has never smoked. She has never used smokeless tobacco. She reports that she drinks about 0.6 - 1.2 oz of alcohol per week . She reports that she does not use drugs.  Past Medical History:  Diagnosis Date  . HSV I   . Hypoglycemia     Past Surgical History:  Procedure Laterality Date  . APPENDECTOMY  02/2014   [place], [place]    Current Outpatient Prescriptions  Medication Sig Dispense Refill  . estradiol (MINIVELLE) 0.05 MG/24HR patch Apply anywhere on lower abdomen.  Change patch on Monday and Thursday during your  menstrual week. 4 patch 11  . ZARAH 3-0.03 MG tablet TAKE 1 TABLET BY MOUTH EVERY DAY 28 tablet 0   No current facility-administered medications for this visit.     Family History  Problem Relation Age of Onset  . Hypertension Father   . Heart disease Father   . Stroke Paternal Grandfather     ROS:  Pertinent items are noted in HPI.  Otherwise, a comprehensive ROS was negative.  Exam:   BP 100/66 (BP Location: Right Arm, Patient Position: Sitting, Cuff Size: Normal)   Pulse 80   Resp 16   Ht 5' 6.5" (1.689 m)   Wt 125 lb 3.2 oz (56.8 kg)   LMP 07/18/2015 (Exact Date)   BMI 19.91 kg/m     General appearance: alert, cooperative and appears stated age Head: Normocephalic, without obvious abnormality, atraumatic Neck: no adenopathy, supple, symmetrical, trachea midline and thyroid normal to inspection and palpation Lungs: clear to auscultation bilaterally Breasts: normal appearance, no masses or tenderness, Inspection negative, No nipple retraction or dimpling, No nipple discharge or bleeding, No axillary or supraclavicular adenopathy, axillary pigmentation bilaterally consistent with possible accessory nipples. Heart: regular rate and rhythm Abdomen: incisions:  Yes.    laparoscopic , soft, non-tender; no masses, no organomegaly Extremities: extremities normal, atraumatic, no cyanosis or edema Skin: Skin color, texture, turgor normal. No rashes or lesions Lymph nodes: Cervical, supraclavicular, and axillary nodes normal. No abnormal  inguinal nodes palpated Neurologic: Grossly normal  Pelvic: External genitalia:  no lesions              Urethra:  normal appearing urethra with no masses, tenderness or lesions              Bartholins and Skenes: normal                 Vagina: normal appearing vagina with normal color and discharge, no lesions              Cervix: no lesions              Pap taken: No. Bimanual Exam:  Uterus:  normal size, contour, position, consistency, mobility,  non-tender              Adnexa: normal adnexa and no mass, fullness, tenderness            Chaperone was present for exam.  Assessment:   Well woman visit with normal exam. Headaches.  Chronic.  On combined OCPs and estrogen patch for menstrual week.  Right breast sensitivity.  Normal exam.   Plan: Yearly mammogram recommended after age 19.  Recommended self breast exam.  Pap and HR HPV as above. Discussed Calcium, Vitamin D, regular exercise program including cardiovascular and weight bearing exercise. Labs performed.  Yes.  .   See orders.  Routine labs.  Prescription medication(s) given.  Yes.  .  See orders.  Zarah and Minivelle twice weekly during menses for one year.  Referral to Dr. Lucia Gaskins in neurology.  Follow up annually and prn.       After visit summary provided.

## 2015-08-10 DIAGNOSIS — S060XAA Concussion with loss of consciousness status unknown, initial encounter: Secondary | ICD-10-CM

## 2015-08-10 DIAGNOSIS — S060X9A Concussion with loss of consciousness of unspecified duration, initial encounter: Secondary | ICD-10-CM

## 2015-08-10 HISTORY — DX: Concussion with loss of consciousness of unspecified duration, initial encounter: S06.0X9A

## 2015-08-10 HISTORY — DX: Concussion with loss of consciousness status unknown, initial encounter: S06.0XAA

## 2015-08-27 ENCOUNTER — Emergency Department (INDEPENDENT_AMBULATORY_CARE_PROVIDER_SITE_OTHER)
Admission: EM | Admit: 2015-08-27 | Discharge: 2015-08-27 | Disposition: A | Discharge status: Home / Self Care | Payer: Managed Care, Other (non HMO) | Source: Home / Self Care | Attending: Family Medicine | Admitting: Family Medicine

## 2015-08-27 ENCOUNTER — Encounter: Payer: Self-pay | Admitting: *Deleted

## 2015-08-27 DIAGNOSIS — S0990XA Unspecified injury of head, initial encounter: Secondary | ICD-10-CM | POA: Diagnosis not present

## 2015-08-27 DIAGNOSIS — R519 Headache, unspecified: Secondary | ICD-10-CM

## 2015-08-27 DIAGNOSIS — R51 Headache: Secondary | ICD-10-CM
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
## 2015-08-27 NOTE — Discharge Instructions (Signed)
May continue Tylenol or ibuprofen as needed. If symptoms become significantly worse during the night or over the weekend, proceed to the local emergency room.

## 2015-08-31 ENCOUNTER — Telehealth: Payer: Self-pay | Admitting: *Deleted

## 2015-08-31 NOTE — Telephone Encounter (Signed)
Callback: No answer, LMOM f/u from visit, call back as needed.  

## 2015-09-01 ENCOUNTER — Encounter: Payer: Self-pay | Admitting: Obstetrics and Gynecology

## 2015-09-01 ENCOUNTER — Encounter: Payer: Self-pay | Admitting: Neurology

## 2015-09-01 ENCOUNTER — Ambulatory Visit (INDEPENDENT_AMBULATORY_CARE_PROVIDER_SITE_OTHER): Payer: Managed Care, Other (non HMO) | Admitting: Neurology

## 2015-09-01 VITALS — BP 121/75 | HR 80 | Ht 67.0 in | Wt 125.4 lb

## 2015-09-01 DIAGNOSIS — G43109 Migraine with aura, not intractable, without status migrainosus: Secondary | ICD-10-CM

## 2015-09-01 DIAGNOSIS — R110 Nausea: Secondary | ICD-10-CM | POA: Diagnosis not present

## 2015-09-01 DIAGNOSIS — G4700 Insomnia, unspecified: Secondary | ICD-10-CM

## 2015-09-01 HISTORY — DX: Migraine with aura, not intractable, without status migrainosus: G43.109

## 2015-09-01 MED ORDER — ONDANSETRON 4 MG PO TBDP: 4.0000 mg | ORAL_TABLET | Freq: Three times a day (TID) | ORAL | 12 refills | Status: AC | PRN

## 2015-09-01 MED ORDER — SUMATRIPTAN SUCCINATE 100 MG PO TABS: 100.0000 mg | ORAL_TABLET | Freq: Once | ORAL | 12 refills | Status: AC | PRN

## 2015-09-01 MED ORDER — NORTRIPTYLINE HCL 10 MG PO CAPS: 20.0000 mg | ORAL_CAPSULE | Freq: Every day | ORAL | 12 refills | Status: AC
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
## 2015-09-01 NOTE — Patient Instructions (Addendum)
Remember to drink plenty of fluid, eat healthy meals and do not skip any meals. Try to eat protein with a every meal and eat a healthy snack such as fruit or nuts in between meals. Try to keep a regular sleep-wake schedule and try to exercise daily, particularly in the form of walking, 20-30 minutes a day, if you can.   As far as your medications are concerned, I would like to suggest: Nortriptyline before bedtime. In 2 weeks if tolerated and needed may increase to 2 pills before bedtime. At the onset of your aura or migraine take one Imitrex. May repeat in 2 hours if needed. No more than twice in 1 day. May take the Zofran for nausea as well.  As far as diagnostic testing: None at this time.  To prevent or relieve headaches, try the following: Cool Compress. Lie down and place a cool compress on your head.  Avoid headache triggers. If certain foods or odors seem to have triggered your migraines in the past, avoid them. A headache diary might help you identify triggers.  Include physical activity in your daily routine. Try a daily walk or other moderate aerobic exercise.  Manage stress. Find healthy ways to cope with the stressors, such as delegating tasks on your to-do list.  Practice relaxation techniques. Try deep breathing, yoga, massage and visualization.  Eat regularly. Eating regularly scheduled meals and maintaining a healthy diet might help prevent headaches. Also, drink plenty of fluids.  Follow a regular sleep schedule. Sleep deprivation might contribute to headaches Consider biofeedback. With this mind-body technique, you learn to control certain bodily functions - such as muscle tension, heart rate and blood pressure - to prevent headaches or reduce headache pain.    Proceed to emergency room if you experience new or worsening symptoms or symptoms do not resolve, if you have new neurologic symptoms or if headache is severe, or for any concerning symptom.    Our phone number is  [phone]. We also have an after hours call service for urgent matters and there is a physician on-call for urgent questions. For any emergencies you know to call 911 or go to the nearest emergency room  Migraine Headache A migraine headache is an intense, throbbing pain on one or both sides of your head. A migraine can last for 30 minutes to several hours. CAUSES  The exact cause of a migraine headache is not always known. However, a migraine may be caused when nerves in the brain become irritated and release chemicals that cause inflammation. This causes pain. Certain things may also trigger migraines, such as:  Alcohol.  Smoking.  Stress.  Menstruation.  Aged cheeses.  Foods or drinks that contain nitrates, glutamate, aspartame, or tyramine.  Lack of sleep.  Chocolate.  Caffeine.  Hunger.  Physical exertion.  Fatigue.  Medicines used to treat chest pain (nitroglycerine), birth control pills, estrogen, and some blood pressure medicines. SIGNS AND SYMPTOMS  Pain on one or both sides of your head.  Pulsating or throbbing pain.  Severe pain that prevents daily activities.  Pain that is aggravated by any physical activity.  Nausea, vomiting, or both.  Dizziness.  Pain with exposure to bright lights, loud noises, or activity.  General sensitivity to bright lights, loud noises, or smells. Before you get a migraine, you may get warning signs that a migraine is coming (aura). An aura may include:  Seeing flashing lights.  Seeing bright spots, halos, or zigzag lines.  Having tunnel vision or blurred  vision.  Having feelings of numbness or tingling.  Having trouble talking.  Having muscle weakness. DIAGNOSIS  A migraine headache is often diagnosed based on:  Symptoms.  Physical exam.  A CT scan or MRI of your head. These imaging tests cannot diagnose migraines, but they can help rule out other causes of headaches. TREATMENT Medicines may be given for  pain and nausea. Medicines can also be given to help prevent recurrent migraines.  HOME CARE INSTRUCTIONS  Only take over-the-counter or prescription medicines for pain or discomfort as directed by your health care provider. The use of long-term narcotics is not recommended.  Lie down in a dark, quiet room when you have a migraine.  Keep a journal to find out what may trigger your migraine headaches. For example, write down:  What you eat and drink.  How much sleep you get.  Any change to your diet or medicines.  Limit alcohol consumption.  Quit smoking if you smoke.  Get 7-9 hours of sleep, or as recommended by your health care provider.  Limit stress.  Keep lights dim if bright lights bother you and make your migraines worse. SEEK IMMEDIATE MEDICAL CARE IF:   Your migraine becomes severe.  You have a fever.  You have a stiff neck.  You have vision loss.  You have muscular weakness or loss of muscle control.  You start losing your balance or have trouble walking.  You feel faint or pass out.  You have severe symptoms that are different from your first symptoms. MAKE SURE YOU:   Understand these instructions.  Will watch your condition.  Will get help right away if you are not doing well or get worse.   This information is not intended to replace advice given to you by your health care provider. Make sure you discuss any questions you have with your health care provider.   Document Released: 12/26/2004 Document Revised: 01/16/2014 Document Reviewed: 09/02/2012 Elsevier Interactive Patient Education 2016 [employer]    Nortriptyline capsules What is this medicine? NORTRIPTYLINE (nor TRIP ti leen) is used to treat depression. This medicine may be used for other purposes; ask your health care provider or pharmacist if you have questions. What should I tell my health care provider before I take this medicine? They need to know if you have any of these  conditions: -an alcohol problem -bipolar disorder or schizophrenia -difficulty passing urine, prostate trouble -glaucoma -heart disease or recent heart attack -liver disease -over active thyroid -seizures -thoughts or plans of suicide or a previous suicide attempt or family history of suicide attempt -an unusual or allergic reaction to nortriptyline, other medicines, foods, dyes, or preservatives -pregnant or trying to get pregnant -breast-feeding How should I use this medicine? Take this medicine by mouth with a glass of water. Follow the directions on the prescription label. Take your doses at regular intervals. Do not take it more often than directed. Do not stop taking this medicine suddenly except upon the advice of your doctor. Stopping this medicine too quickly may cause serious side effects or your condition may worsen. A special MedGuide will be given to you by the pharmacist with each prescription and refill. Be sure to read this information carefully each time. Talk to your pediatrician regarding the use of this medicine in children. Special care may be needed. Overdosage: If you think you have taken too much of this medicine contact a poison control center or emergency room at once. NOTE: This medicine is only for  you. Do not share this medicine with others. What if I miss a dose? If you miss a dose, take it as soon as you can. If it is almost time for your next dose, take only that dose. Do not take double or extra doses. What may interact with this medicine? Do not take this medicine with any of the following medications: -arsenic trioxide -certain medicines medicines for irregular heart beat -cisapride -halofantrine -linezolid -MAOIs like Carbex, Eldepryl, Marplan, Nardil, and Parnate -methylene blue (injected into a vein) -other medicines for mental depression -phenothiazines like perphenazine, thioridazine and  chlorpromazine -pimozide -probucol -procarbazine -sparfloxacin -St. John's Wort -ziprasidone This medicine may also interact with any of the following medications: -atropine and related drugs like hyoscyamine, scopolamine, tolterodine and others -barbiturate medicines for inducing sleep or treating seizures, such as phenobarbital -cimetidine -medicines for diabetes -medicines for seizures like carbamazepine or phenytoin -reserpine -thyroid medicine This list may not describe all possible interactions. Give your health care provider a list of all the medicines, herbs, non-prescription drugs, or dietary supplements you use. Also tell them if you smoke, drink alcohol, or use illegal drugs. Some items may interact with your medicine. What should I watch for while using this medicine? Tell your doctor if your symptoms do not get better or if they get worse. Visit your doctor or health care professional for regular checks on your progress. Because it may take several weeks to see the full effects of this medicine, it is important to continue your treatment as prescribed by your doctor. Patients and their families should watch out for new or worsening thoughts of suicide or depression. Also watch out for sudden changes in feelings such as feeling anxious, agitated, panicky, irritable, hostile, aggressive, impulsive, severely restless, overly excited and hyperactive, or not being able to sleep. If this happens, especially at the beginning of treatment or after a change in dose, call your health care professional. Bonita QuinYou may get drowsy or dizzy. Do not drive, use machinery, or do anything that needs mental alertness until you know how this medicine affects you. Do not stand or sit up quickly, especially if you are an older patient. This reduces the risk of dizzy or fainting spells. Alcohol may interfere with the effect of this medicine. Avoid alcoholic drinks. Do not treat yourself for coughs, colds, or  allergies without asking your doctor or health care professional for advice. Some ingredients can increase possible side effects. Your mouth may get dry. Chewing sugarless gum or sucking hard candy, and drinking plenty of water may help. Contact your doctor if the problem does not go away or is severe. This medicine may cause dry eyes and blurred vision. If you wear contact lenses you may feel some discomfort. Lubricating drops may help. See your eye doctor if the problem does not go away or is severe. This medicine can cause constipation. Try to have a bowel movement at least every 2 to 3 days. If you do not have a bowel movement for 3 days, call your doctor or health care professional. This medicine can make you more sensitive to the sun. Keep out of the sun. If you cannot avoid being in the sun, wear protective clothing and use sunscreen. Do not use sun lamps or tanning beds/booths. What side effects may I notice from receiving this medicine? Side effects that you should report to your doctor or health care professional as soon as possible: -allergic reactions like skin rash, itching or hives, swelling of the face, lips,  or tongue -abnormal production of milk in females -breast enlargement in both males and females -breathing problems -confusion, hallucinations -fever with increased sweating -irregular or fast, pounding heartbeat -muscle stiffness, or spasms -pain or difficulty passing urine, loss of bladder control -seizures -suicidal thoughts or other mood changes -swelling of the testicles -tingling, pain, or numbness in the feet or hands -yellowing of the eyes or skin Side effects that usually do not require medical attention (report to your doctor or health care professional if they continue or are bothersome): -change in sex drive or performance -diarrhea -nausea, vomiting -weight gain or loss This list may not describe all possible side effects. Call your doctor for medical advice  about side effects. You may report side effects to FDA at 1-800-FDA-1088. Where should I keep my medicine? Keep out of the reach of children. Store at room temperature between 15 and 30 degrees C (59 and 86 degrees F). Keep container tightly closed. Throw away any unused medicine after the expiration date. NOTE: This sheet is a summary. It may not cover all possible information. If you have questions about this medicine, talk to your doctor, pharmacist, or health care provider.    2016, Elsevier/[employer] Standard. (2011-05-15 13:57:12)  Sumatriptan tablets What is this medicine? SUMATRIPTAN (soo ma TRIP tan) is used to treat migraines with or without aura. An aura is a strange feeling or visual disturbance that warns you of an attack. It is not used to prevent migraines. This medicine may be used for other purposes; ask your health care provider or pharmacist if you have questions. What should I tell my health care provider before I take this medicine? They need to know if you have any of these conditions: -circulation problems in fingers and toes -diabetes -heart disease -high blood pressure -high cholesterol -history of irregular heartbeat -history of stroke -kidney disease -liver disease -postmenopausal or surgical removal of uterus and ovaries -seizures -smoke tobacco -stomach or intestine problems -an unusual or allergic reaction to sumatriptan, other medicines, foods, dyes, or preservatives -pregnant or trying to get pregnant -breast-feeding How should I use this medicine? Take this medicine by mouth with a glass of water. Follow the directions on the prescription label. This medicine is taken at the first symptoms of a migraine. It is not for everyday use. If your migraine headache returns after one dose, you can take another dose as directed. You must leave at least 2 hours between doses, and do not take more than 100 mg as a single dose. Do not take more than 200 mg total in any  24 hour period. If there is no improvement at all after the first dose, do not take a second dose without talking to your doctor or health care professional. Do not take your medicine more often than directed. Talk to your pediatrician regarding the use of this medicine in children. Special care may be needed. Overdosage: If you think you have taken too much of this medicine contact a poison control center or emergency room at once. NOTE: This medicine is only for you. Do not share this medicine with others. What if I miss a dose? This does not apply; this medicine is not for regular use. What may interact with this medicine? Do not take this medicine with any of the following medicines: -cocaine -ergot alkaloids like dihydroergotamine, ergonovine, ergotamine, methylergonovine -feverfew -MAOIs like Carbex, Eldepryl, Marplan, Nardil, and Parnate -other medicines for migraine headache like almotriptan, eletriptan, frovatriptan, naratriptan, rizatriptan, zolmitriptan -tryptophan This medicine may  also interact with the following medications: -certain medicines for depression, anxiety, or psychotic disturbances This list may not describe all possible interactions. Give your health care provider a list of all the medicines, herbs, non-prescription drugs, or dietary supplements you use. Also tell them if you smoke, drink alcohol, or use illegal drugs. Some items may interact with your medicine. What should I watch for while using this medicine? Only take this medicine for a migraine headache. Take it if you get warning symptoms or at the start of a migraine attack. It is not for regular use to prevent migraine attacks. You may get drowsy or dizzy. Do not drive, use machinery, or do anything that needs mental alertness until you know how this medicine affects you. To reduce dizzy or fainting spells, do not sit or stand up quickly, especially if you are an older patient. Alcohol can increase drowsiness,  dizziness and flushing. Avoid alcoholic drinks. Smoking cigarettes may increase the risk of heart-related side effects from using this medicine. If you take migraine medicines for 10 or more days a month, your migraines may get worse. Keep a diary of headache days and medicine use. Contact your healthcare professional if your migraine attacks occur more frequently. What side effects may I notice from receiving this medicine? Side effects that you should report to your doctor or health care professional as soon as possible: -allergic reactions like skin rash, itching or hives, swelling of the face, lips, or tongue -bloody or watery diarrhea -hallucination, loss of contact with reality -pain, tingling, numbness in the face, hands, or feet -seizures -signs and symptoms of a blood clot such as breathing problems; changes in vision; chest pain; severe, sudden headache; pain, swelling, warmth in the leg; trouble speaking; sudden numbness or weakness of the face, arm, or leg -signs and symptoms of a dangerous change in heartbeat or heart rhythm like chest pain; dizziness; fast or irregular heartbeat; palpitations, feeling faint or lightheaded; falls; breathing problems -signs and symptoms of a stroke like changes in vision; confusion; trouble speaking or understanding; severe headaches; sudden numbness or weakness of the face, arm, or leg; trouble walking; dizziness; loss of balance or coordination -stomach pain Side effects that usually do not require medical attention (report these to your doctor or health care professional if they continue or are bothersome): -changes in taste -facial flushing -headache -muscle cramps -muscle pain -nausea, vomiting -weak or tired This list may not describe all possible side effects. Call your doctor for medical advice about side effects. You may report side effects to FDA at 1-800-FDA-1088. Where should I keep my medicine? Keep out of the reach of  children. Store at room temperature between 2 and 30 degrees C (36 and 86 degrees F). Throw away any unused medicine after the expiration date. NOTE: This sheet is a summary. It may not cover all possible information. If you have questions about this medicine, talk to your doctor, pharmacist, or health care provider.    2016, Elsevier/Gold Standard. (2014-07-02 17:46:40)   Ondansetron oral dissolving tablet What is this medicine? ONDANSETRON (on DAN se tron) is used to treat nausea and vomiting caused by chemotherapy. It is also used to prevent or treat nausea and vomiting after surgery. This medicine may be used for other purposes; ask your health care provider or pharmacist if you have questions. What should I tell my health care provider before I take this medicine? They need to know if you have any of these conditions: -heart disease -history of  irregular heartbeat -liver disease -low levels of magnesium or potassium in the blood -an unusual or allergic reaction to ondansetron, granisetron, other medicines, foods, dyes, or preservatives -pregnant or trying to get pregnant -breast-feeding How should I use this medicine? These tablets are made to dissolve in the mouth. Do not try to push the tablet through the foil backing. With dry hands, peel away the foil backing and gently remove the tablet. Place the tablet in the mouth and allow it to dissolve, then swallow. While you may take these tablets with water, it is not necessary to do so. Talk to your pediatrician regarding the use of this medicine in children. Special care may be needed. Overdosage: If you think you have taken too much of this medicine contact a poison control center or emergency room at once. NOTE: This medicine is only for you. Do not share this medicine with others. What if I miss a dose? If you miss a dose, take it as soon as you can. If it is almost time for your next dose, take only that dose. Do not take double or  extra doses. What may interact with this medicine? Do not take this medicine with any of the following medications: -apomorphine -certain medicines for fungal infections like fluconazole, itraconazole, ketoconazole, posaconazole, voriconazole -cisapride -dofetilide -dronedarone -pimozide -thioridazine -ziprasidone This medicine may also interact with the following medications: -carbamazepine -certain medicines for depression, anxiety, or psychotic disturbances -fentanyl -linezolid -MAOIs like Carbex, Eldepryl, Marplan, Nardil, and Parnate -methylene blue (injected into a vein) -other medicines that prolong the QT interval (cause an abnormal heart rhythm) -phenytoin -rifampicin -tramadol This list may not describe all possible interactions. Give your health care provider a list of all the medicines, herbs, non-prescription drugs, or dietary supplements you use. Also tell them if you smoke, drink alcohol, or use illegal drugs. Some items may interact with your medicine. What should I watch for while using this medicine? Check with your doctor or health care professional as soon as you can if you have any sign of an allergic reaction. What side effects may I notice from receiving this medicine? Side effects that you should report to your doctor or health care professional as soon as possible: -allergic reactions like skin rash, itching or hives, swelling of the face, lips, or tongue -breathing problems -confusion -dizziness -fast or irregular heartbeat -feeling faint or lightheaded, falls -fever and chills -loss of balance or coordination -seizures -sweating -swelling of the hands and feet -tightness in the chest -tremors -unusually weak or tired Side effects that usually do not require medical attention (report to your doctor or health care professional if they continue or are bothersome): -constipation or diarrhea -headache This list may not describe all possible side  effects. Call your doctor for medical advice about side effects. You may report side effects to FDA at 1-800-FDA-1088. Where should I keep my medicine? Keep out of the reach of children. Store between 2 and 30 degrees C (36 and 86 degrees F). Throw away any unused medicine after the expiration date. NOTE: This sheet is a summary. It may not cover all possible information. If you have questions about this medicine, talk to your doctor, pharmacist, or health care provider.    2016, Elsevier/[employer] Standard. (2012-10-02 16:21:52)

## 2015-09-02 ENCOUNTER — Encounter: Payer: Self-pay | Admitting: Neurology

## 2015-09-02 ENCOUNTER — Ambulatory Visit (INDEPENDENT_AMBULATORY_CARE_PROVIDER_SITE_OTHER): Payer: Managed Care, Other (non HMO) | Admitting: Obstetrics and Gynecology

## 2015-09-02 ENCOUNTER — Encounter: Payer: Self-pay | Admitting: Obstetrics and Gynecology

## 2015-09-02 VITALS — BP 100/66 | HR 100 | Ht 66.5 in | Wt 124.0 lb

## 2015-09-02 DIAGNOSIS — G43109 Migraine with aura, not intractable, without status migrainosus: Secondary | ICD-10-CM

## 2015-09-02 DIAGNOSIS — Z3009 Encounter for other general counseling and advice on contraception: Secondary | ICD-10-CM | POA: Diagnosis not present

## 2015-09-02 MED ORDER — NORETHINDRONE 0.35 MG PO TABS: 1.0000 | ORAL_TABLET | Freq: Every day | ORAL | 10 refills | Status: DC

## 2015-09-02 NOTE — Progress Notes (Signed)
GYNECOLOGY  VISIT   HPI: 25 y.o.   Single  Caucasian  female   G0P0000 with Patient's last menstrual period was 08/08/2015 (exact date).   here for consult to discuss birth control options. Patient has been diagnosed with migraines with aura. Saw Dr. Lucia GaskinsAhern yesterday.  Rx for Pamelor nightly.  Imitrex prn migraine.  Zofran prn.   On combined oral contraception.  Was having significant dysmenorrhea and heavy flow prior to starting OCPs.   GYNECOLOGIC HISTORY: Patient's last menstrual period was 08/08/2015 (exact date). Contraception:  OCPs--Yasmin Menopausal hormone therapy:  n/a Last mammogram:  n/a Last pap smear:   07-31-14 Neg        OB History    Gravida Para Term Preterm AB Living   0 0 0 0 0 0   SAB TAB Ectopic Multiple Live Births   0 0 0 0 0         Patient Active Problem List   Diagnosis Date Noted  . Recurrent tonsillitis 01/03/2013    Past Medical History:  Diagnosis Date  . HSV I   . Hypoglycemia   . Migraine headache with aura 09/01/2015  . Mild concussion 08/2015   was evaluated at Urgent Care    Past Surgical History:  Procedure Laterality Date  . APPENDECTOMY  02/2014   [place], [place]    Current Outpatient Prescriptions  Medication Sig Dispense Refill  . nortriptyline (PAMELOR) 10 MG capsule Take 2 capsules (20 mg total) by mouth at bedtime. Start with one pill at night for 2 weeks and can increase if needed. 60 capsule 12  . ondansetron (ZOFRAN ODT) 4 MG disintegrating tablet Take 1 tablet (4 mg total) by mouth every 8 (eight) hours as needed for nausea or vomiting. 20 tablet 12  . SUMAtriptan (IMITREX) 100 MG tablet Take 1 tablet (100 mg total) by mouth once as needed for migraine. May repeat in 2 hours if headache persists or recurs. 10 tablet 12  . norethindrone (MICRONOR,CAMILA,ERRIN) 0.35 MG tablet Take 1 tablet (0.35 mg total) by mouth daily. 1 Package 10   No current facility-administered medications for this visit.      ALLERGIES:  Coconut flavor  Family History  Problem Relation Age of Onset  . Hypertension Father   . Heart disease Father   . Stroke Paternal Grandfather   . Endometrial cancer Mother   . COPD Paternal Grandmother   . Emphysema Paternal Grandmother     Social History   Social History  . Marital status: Single    Spouse name: N/A  . Number of children: 12  . Years of education: 0   Occupational History  . Not on file.   Social History Main Topics  . Smoking status: Never Smoker  . Smokeless tobacco: Never Used  . Alcohol use 0.6 - 1.2 oz/week    1 - 2 Standard drinks or equivalent per week     Comment: 1-2 beers a week  . Drug use: No  . Sexual activity: Yes    Partners: Male    Birth control/ protection: None, Pill     Comment: Yasmin   Other Topics Concern  . Not on file   Social History Narrative   Lives with parents   Caffeine use: none     ROS:  Pertinent items are noted in HPI.  PHYSICAL EXAMINATION:    BP 100/66 (BP Location: Right Arm, Patient Position: Sitting, Cuff Size: Normal)   Pulse 100   Ht 5' 6.5" (1.689  m)   Wt 124 lb (56.2 kg)   LMP 08/08/2015 (Exact Date)   BMI 19.71 kg/m     General appearance: alert, cooperative and appears stated age  ASSESSMENT  Migraine with aura. New dx.  Patient is on combined oral contraception. Adult acne.   PLAN  Discussion of increased risk of stroke while on combined oral contraception.  Will stop combined OCPs and estrogen patch during her menses. Discussion of options for tx - Micronor, Depo Provera, Nexplanon, Kyleena IUD.  Will try Micronor.  Instructed in used.  Refills until annual exam next July.  Information on [place] verbally and brochure given.  Call if struggling with acne and will add spironolactone starting at 100 mg daily.  Told this may cause dizziness.     An After Visit Summary was printed and given to the patient.  _25_____ minutes face to face time of which over 50% was spent in  counseling.

## 2015-09-02 NOTE — Patient Instructions (Signed)
Norethindrone tablets (contraception) What is this medicine? NORETHINDRONE (nor eth IN drone) is an oral contraceptive. The product contains a female hormone known as a progestin. It is used to prevent pregnancy. This medicine may be used for other purposes; ask your health care provider or pharmacist if you have questions. What should I tell my health care provider before I take this medicine? They need to know if you have any of these conditions: -blood vessel disease or blood clots -breast, cervical, or vaginal cancer -diabetes -heart disease -kidney disease -liver disease -mental depression -migraine -seizures -stroke -vaginal bleeding -an unusual or allergic reaction to norethindrone, other medicines, foods, dyes, or preservatives -pregnant or trying to get pregnant -breast-feeding How should I use this medicine? Take this medicine by mouth with a glass of water. You may take it with or without food. Follow the directions on the prescription label. Take this medicine at the same time each day and in the order directed on the package. Do not take your medicine more often than directed. Contact your pediatrician regarding the use of this medicine in children. Special care may be needed. This medicine has been used in female children who have started having menstrual periods. A patient package insert for the product will be given with each prescription and refill. Read this sheet carefully each time. The sheet may change frequently. Overdosage: If you think you have taken too much of this medicine contact a poison control center or emergency room at once. NOTE: This medicine is only for you. Do not share this medicine with others. What if I miss a dose? Try not to miss a dose. Every time you miss a dose or take a dose late your chance of pregnancy increases. When 1 pill is missed (even if only 3 hours late), take the missed pill as soon as possible and continue taking a pill each day at  the regular time (use a back up method of birth control for the next 48 hours). If more than 1 dose is missed, use an additional birth control method for the rest of your pill pack until menses occurs. Contact your health care professional if more than 1 dose has been missed. What may interact with this medicine? Do not take this medicine with any of the following medications: -amprenavir or fosamprenavir -bosentan This medicine may also interact with the following medications: -antibiotics or medicines for infections, especially rifampin, rifabutin, rifapentine, and griseofulvin, and possibly penicillins or tetracyclines -aprepitant -barbiturate medicines, such as phenobarbital -carbamazepine -felbamate -modafinil -oxcarbazepine -phenytoin -ritonavir or other medicines for HIV infection or AIDS -St. John's wort -topiramate This list may not describe all possible interactions. Give your health care provider a list of all the medicines, herbs, non-prescription drugs, or dietary supplements you use. Also tell them if you smoke, drink alcohol, or use illegal drugs. Some items may interact with your medicine. What should I watch for while using this medicine? Visit your doctor or health care professional for regular checks on your progress. You will need a regular breast and pelvic exam and Pap smear while on this medicine. Use an additional method of birth control during the first cycle that you take these tablets. If you have any reason to think you are pregnant, stop taking this medicine right away and contact your doctor or health care professional. If you are taking this medicine for hormone related problems, it may take several cycles of use to see improvement in your condition. This medicine does not protect you   against HIV infection (AIDS) or any other sexually transmitted diseases. What side effects may I notice from receiving this medicine? Side effects that you should report to your  doctor or health care professional as soon as possible: -breast tenderness or discharge -pain in the abdomen, chest, groin or leg -severe headache -skin rash, itching, or hives -sudden shortness of breath -unusually weak or tired -vision or speech problems -yellowing of skin or eyes Side effects that usually do not require medical attention (report to your doctor or health care professional if they continue or are bothersome): -changes in sexual desire -change in menstrual flow -facial hair growth -fluid retention and swelling -headache -irritability -nausea -weight gain or loss This list may not describe all possible side effects. Call your doctor for medical advice about side effects. You may report side effects to FDA at 1-800-FDA-1088. Where should I keep my medicine? Keep out of the reach of children. Store at room temperature between 15 and 30 degrees C (59 and 86 degrees F). Throw away any unused medicine after the expiration date. NOTE: This sheet is a summary. It may not cover all possible information. If you have questions about this medicine, talk to your doctor, pharmacist, or health care provider.    2016, Elsevier/Gold Standard. (2011-09-15 16:41:35)  

## 2015-09-04 DIAGNOSIS — G43109 Migraine with aura, not intractable, without status migrainosus: Secondary | ICD-10-CM | POA: Insufficient documentation

## 2015-09-06 ENCOUNTER — Encounter: Payer: Self-pay | Admitting: Obstetrics and Gynecology

## 2015-09-06 ENCOUNTER — Encounter: Payer: Self-pay | Admitting: Neurology

## 2015-09-06 NOTE — Telephone Encounter (Signed)
I've sent an email to Dr Lucia GaskinsAhern, need to confirm she doesn't have auras prior to prescribing combination OCP's

## 2015-09-06 NOTE — Telephone Encounter (Signed)
Left message to call Cashlynn Yearwood at [phone]. 

## 2015-09-06 NOTE — Telephone Encounter (Signed)
Advised we have received her mychart message and Dr.Jertson has sent a message to Dr.Ahern to discuss prescribing OCPs for patient. Advised she will be notified of recommendations as soon as this is reviewed with Dr.Ahern. She is agreeable.

## 2015-09-07 ENCOUNTER — Encounter: Payer: Self-pay | Admitting: Obstetrics and Gynecology

## 2015-09-07 NOTE — Telephone Encounter (Signed)
Spoke with patient. Patient states that she received a mychart message from Dr.Ahern today stating that she may continue taking Zarah OCP at this time and no changes need to be made. Advised I will notify the covering physician in regards to refills. She is agreeable.

## 2015-09-08 ENCOUNTER — Telehealth: Payer: Self-pay | Admitting: *Deleted

## 2015-09-08 MED ORDER — DROSPIRENONE-ETHINYL ESTRADIOL 3-0.03 MG PO TABS: 1.0000 | ORAL_TABLET | Freq: Every day | ORAL | 3 refills | Status: DC

## 2015-09-08 NOTE — Telephone Encounter (Signed)
My Chart message from Dr Lucia GaskinsAhern printed and will be scanned into EPIC. Copy sent to your desk for review. Please advise or RX. Annual exam on 08-02-15 with Dr Edward JollySilva.

## 2015-09-08 NOTE — Telephone Encounter (Signed)
Per Dr Lucia GaskinsAhern, not aura's, she can be on OCP's, I will send in OCP's. I called patient to review, left a message that her script was sent in.

## 2015-09-08 NOTE — Telephone Encounter (Signed)
My chart message from patient:  Dr. Edward JollySilva,    Attached is a screenshot of the message from Dr. Lucia GaskinsAhern regarding my migraines. Because it's not with aura, she said it's fine to not change my birth control, so if you could send in a prescription for my Zarah birth control, I'd really appreciate it as I'm supposed to start my new pack on Thursday. My pharmacy is still [facility] on [address] in [place].     Thank you!    Margaret Cole

## 2015-09-11 ENCOUNTER — Telehealth: Payer: Self-pay | Admitting: Neurology

## 2015-09-11 NOTE — Telephone Encounter (Signed)
Margaret LarssonJill, I am going to request the notes from her ophthalmologist and talk to patient. Will keep you updated

## 2015-09-11 NOTE — Telephone Encounter (Signed)
I called an left a message for patient regarding her emails to me about the black spots in her vision. I would like to discuss again with patient as well as her birth control given increased risk of stroke in patient with aura. I would like a copy of the eye doctor report stating that the spots are floaters and I would like to discuss again with patient the risk of increase stroke in patients with aura. I will call her back this weekend or next week if I cannot reach her. Will keep Dr. Oscar LaJertson and Dr. Edward JollySilva informed (CC Drs Oscar LaJertson and Edward JollySilva)  thanks

## 2015-09-13 ENCOUNTER — Telehealth: Payer: Self-pay | Admitting: Obstetrics and Gynecology

## 2015-09-13 NOTE — Telephone Encounter (Signed)
Please let the patient know that we received a message from Dr Lucia GaskinsAhern that she wanted to further discuss the possibility of aura with the patient and review her opthalmology records. After receiving the patients message with the attached message from Dr Lucia GaskinsAhern last week, her OCP were sent in last week (she was due to start on Thursday). Please see prior notes. Please let the patient know that she will need to further discuss with Dr Lucia GaskinsAhern she may need to go off of OCP's again.

## 2015-09-14 ENCOUNTER — Telehealth: Payer: Self-pay

## 2015-09-14 NOTE — Telephone Encounter (Signed)
Spoke with patient. Advised of message as seen below from Dr.Jertson (please see telephone encounter dated 09/13/2015). Patient verbalizes understanding. Patient will contact Dr.Ahern's office to discuss and will have records from opthalmology sent for Dr.Ahern's review. Patient has been notified of risk for stroke with taking combination OCP if she is having migraines with auras. Advised she will need to discontinue OCP due to this risk if she is having migraines with auras and if she is unable to have records reviewed with Dr.Ahern. Advised Dr.Ahern will review her medical records and notify Dr.Jertson of recommendations. She is aware she will be notified of recommendation from Dr.Ahern and Dr.Jertson.  Dr.Jertson, do you agree with these recommendations?

## 2015-09-14 NOTE — Telephone Encounter (Signed)
Left message to call Margaret Cole at [phone].  [name], MD  09/13/15 1:59 PM  Note    Please let the patient know that we received a message from Dr Lucia GaskinsAhern that she wanted to further discuss the possibility of aura with the patient and review her opthalmology records. After receiving the patients message with the attached message from Dr Lucia GaskinsAhern last week, her OCP were sent in last week (she was due to start on Thursday). Please see prior notes. Please let the patient know that she will need to further discuss with Dr Lucia GaskinsAhern she may need to go off of OCP's again.

## 2015-09-14 NOTE — Telephone Encounter (Signed)
Yes, thank you.

## 2015-09-14 NOTE — Telephone Encounter (Signed)
Pt returned Dr Roberts GaudyA's call. She also said her optometrist is sending records to Dr A also.

## 2015-09-14 NOTE — Telephone Encounter (Signed)
Dr Lucia GaskinsAhern- here is the phone note so you can document what you spoke with pt about, thanks.   Called and spoke to pt. She stated she requested records this morning from optometrist around 1045am. Advised we are still waiting on these. Dr Lucia GaskinsAhern also spoke to patient while she was on the phone.   Will call [facility] [phone] to f/u tomorrow to see if they sent records. Their office is now closed.

## 2015-09-14 NOTE — Telephone Encounter (Signed)
Margaret Cole, would you call patient and see who her optometrist is? I'm waiting for records, maybe we can call them. Thanks.

## 2015-09-14 NOTE — Telephone Encounter (Signed)
Thank you. I discussed again with patient that there is increased risk for stroke in women with migraine with aura and a  Contraindication for the combined contraceptive pill for use by women who have migraine with aura, which is in line with World Health Organisation recommendations. The risk for women with migraine without aura is lower and other risk factors like smoking are far more likely to increase stroke risk than migraine. There is a recommendation for no smoking and for the use of low estrogen or progestogen only pills particularly for women with migraine with aura. It is important however that women with migraine who are taking the pill do not decide to suddenly stop taking it without discussing this with their doctor.   Discussed with patient. We will follow up with optometry tomorrow. Patient says that she sees the floaters all the time, she is paying more attention to them, if she looks up at the sky she will see them, they are not associated with migraines and they are always there. 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 Patient was not paying attention in the past when she told me that the vision changes only occur 30 minutes before the headache. She just wasn't paying attention and now she sees them all the time and not related to headaches. On the phone she stated if she looks up she can still see the little black dots and she is having no symptoms currently of migraine. Will review optometry notes and consult with OB/GYN.

## 2015-09-15 NOTE — Telephone Encounter (Signed)
Called and spoke to [place] at [facility]. She stated they did fax office notes to us yesterday, may have not gone through. Asked her to re-fax to [phone], Christiane Haattn Emma. She verbalized understanding.

## 2015-09-15 NOTE — Telephone Encounter (Signed)
Dr Lucia GaskinsAhern- received office note from her optometry office. I put with your notes from today for review, thank you.

## 2015-09-15 NOTE — Telephone Encounter (Signed)
Message noted from Dr. Jertson. Encounter closed.  

## 2015-09-17 ENCOUNTER — Telehealth: Payer: Self-pay | Admitting: Neurology

## 2015-09-17 NOTE — Telephone Encounter (Signed)
Dr. Edward JollySilva:   Spoke to Dr. Joella Princepouydel who is an optometrist. Based on what patient was describing to him he diagnosed floaters and he did confirm by examination. Can;t rule out migraine with aura however based on what he saw and what she describes these are consistent with floaters.   I discussed again with patient that there is increased risk for stroke in women with migraine with aura and a  Contraindication for the combined contraceptive pill for use by women who have migraine with aura, which is in line with World Health Organisation recommendations. The risk for women with migraine without aura is lower and other risk factors like smoking are far more likely to increase stroke risk than migraine. There is a recommendation for no smoking and for the use of low estrogen or progestogen only pills particularly for women with migraine with aura. It is important however that women with migraine who are taking the pill do not decide to suddenly stop taking it without discussing this with their doctor.   So it appears these are likely floaters but cannot definitively rule out migraine with aura. However Patient understands this.  Patient also understands risks and she does not want to change her birth control and so I am comfortable with keeping her on her current birth control medication. thanks

## 2015-09-19 NOTE — Telephone Encounter (Signed)
Please inform patient of note from Dr. Lucia GaskinsAhern that she may continue on the Yasmin as there is no definitive confirmation that she has migraines with aura.  OK to refill until July 2017.

## 2015-09-20 ENCOUNTER — Telehealth: Payer: Self-pay | Admitting: Obstetrics and Gynecology

## 2015-09-20 MED ORDER — DROSPIRENONE-ETHINYL ESTRADIOL 3-0.03 MG PO TABS: 1.0000 | ORAL_TABLET | Freq: Every day | ORAL | 3 refills | Status: DC

## 2015-09-20 NOTE — Telephone Encounter (Signed)
Left message to call Idalis Hoelting at [phone]. 

## 2015-09-20 NOTE — Telephone Encounter (Signed)
[facility], no open telephone note?

## 2015-09-20 NOTE — Telephone Encounter (Signed)
Spoke with patient. Advised of message as seen below from Dr.Silva. Patient is agreeable and verbalizes understanding. Rx for Yasmin #3 3RF sent to patient's pharmacy on file until her next aex.  Routing to provider for final review. Patient agreeable to disposition. Will close encounter.

## 2015-09-20 NOTE — Telephone Encounter (Signed)
Please see telephone encounter dated 09/17/2015.  Routing to provider for final review. Patient agreeable to disposition. Will close encounter.

## 2015-09-20 NOTE — Addendum Note (Signed)
Addended by: Jannet AskewHINES, [name] E on: 09/20/2015 03:45 PM   Modules accepted: Orders

## 2015-12-13 ENCOUNTER — Ambulatory Visit: Payer: Managed Care, Other (non HMO) | Admitting: Neurology

## 2016-01-06 IMAGING — CR DG TIBIA/FIBULA 2V*L*
3 series · 3 of 3 positions shown · non-contrast
Comparison: None.

CLINICAL DATA: Left lower leg pain.

EXAM:
LEFT TIBIA AND FIBULA - 2 VIEW

[AP (1 of 2)]
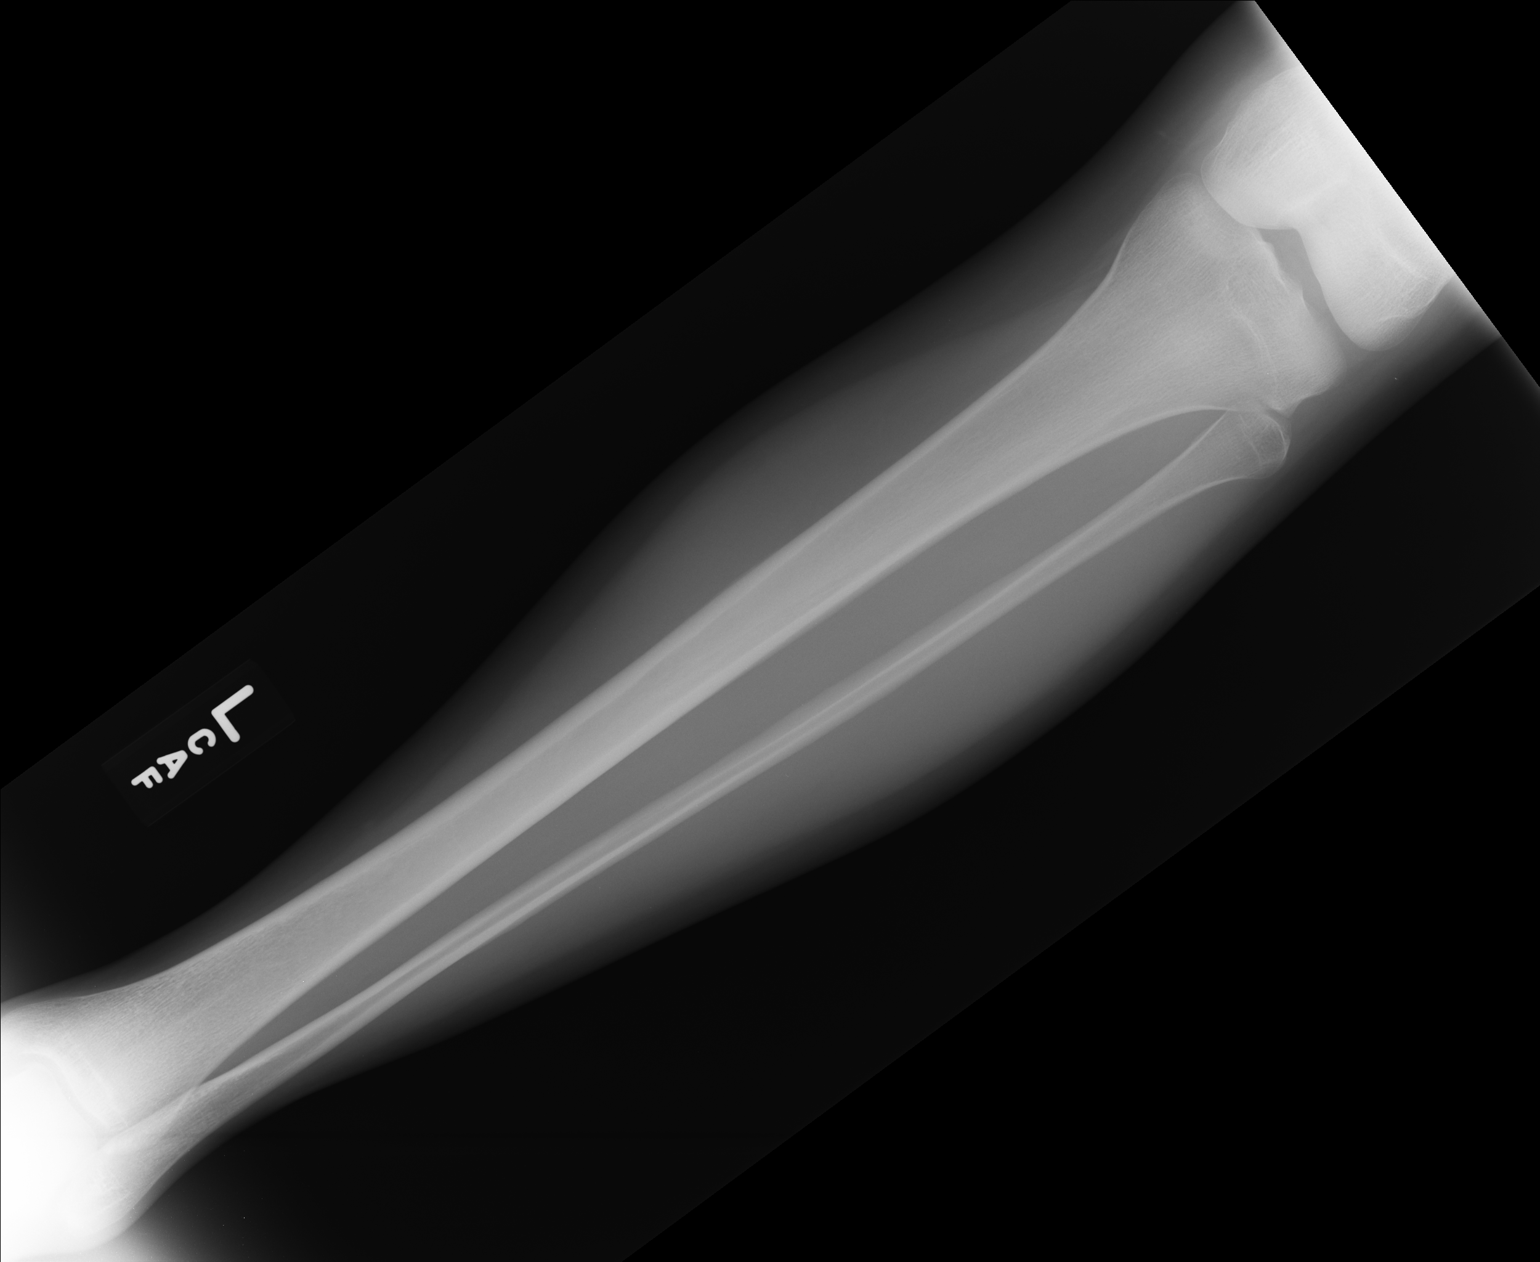

[lateral]
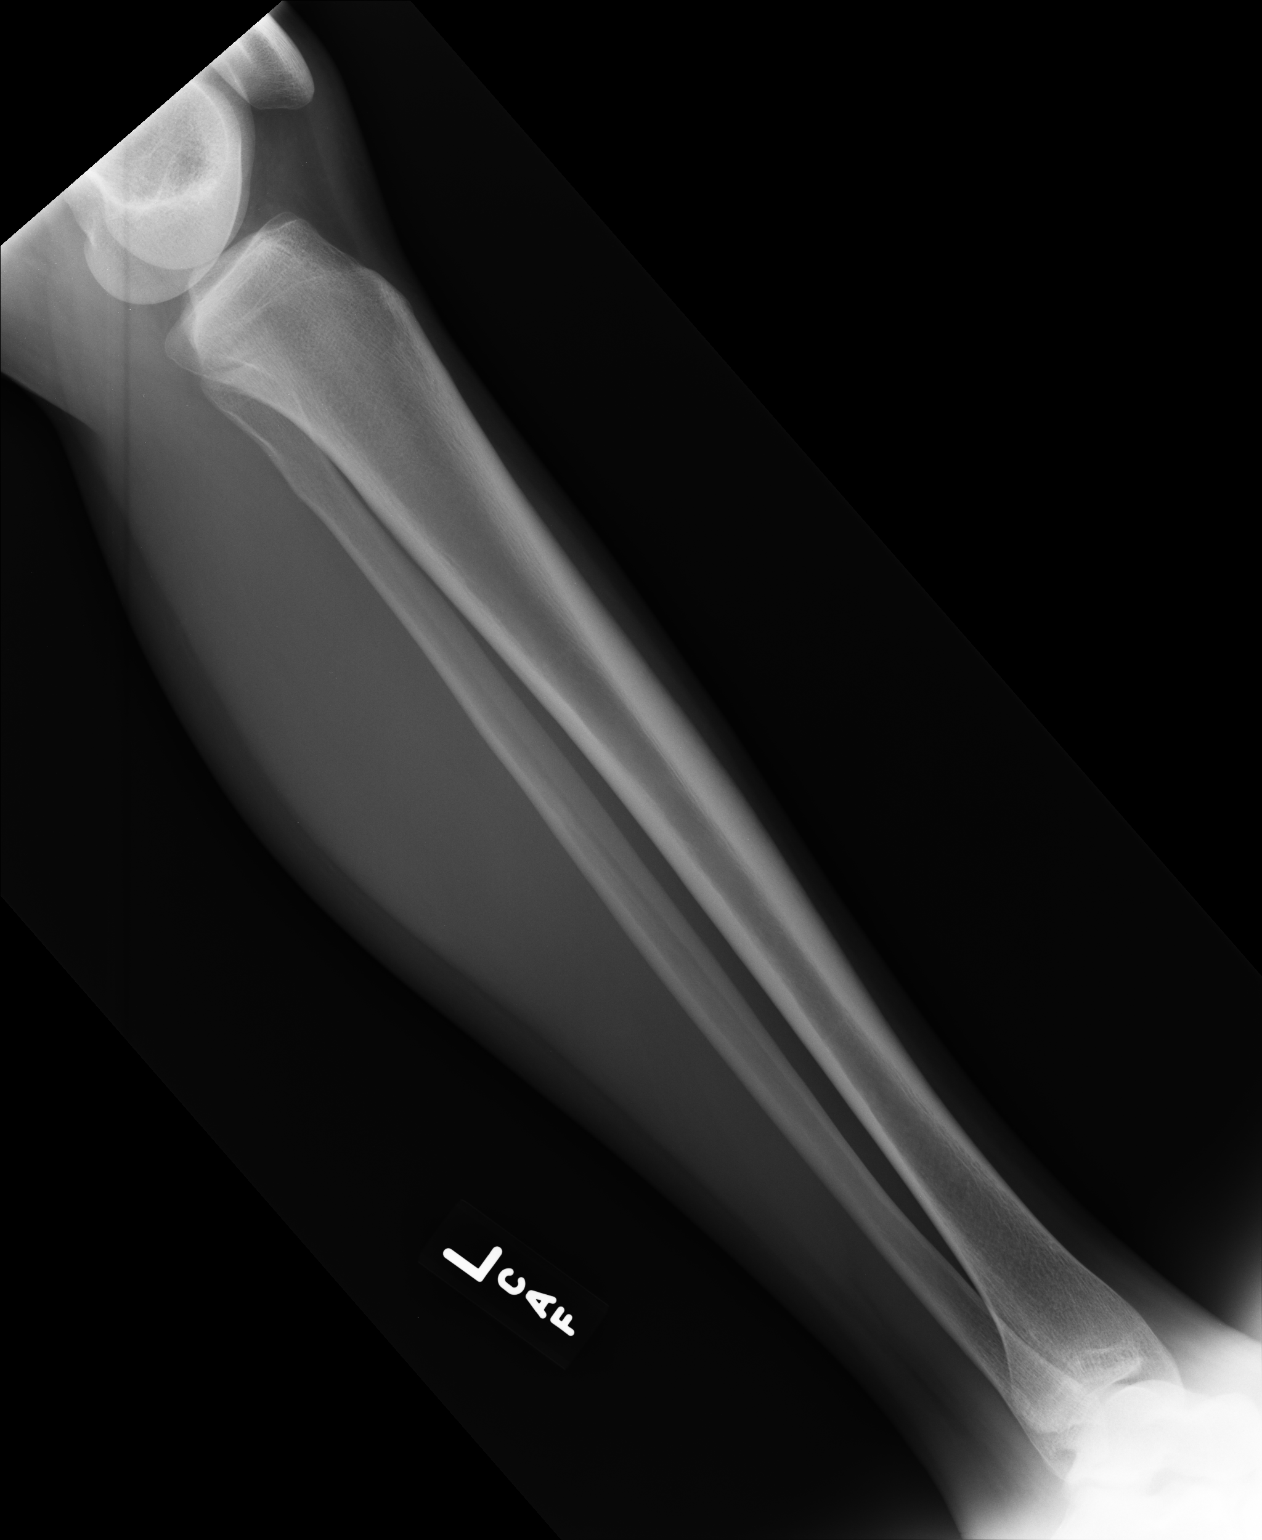

[AP (2 of 2)]
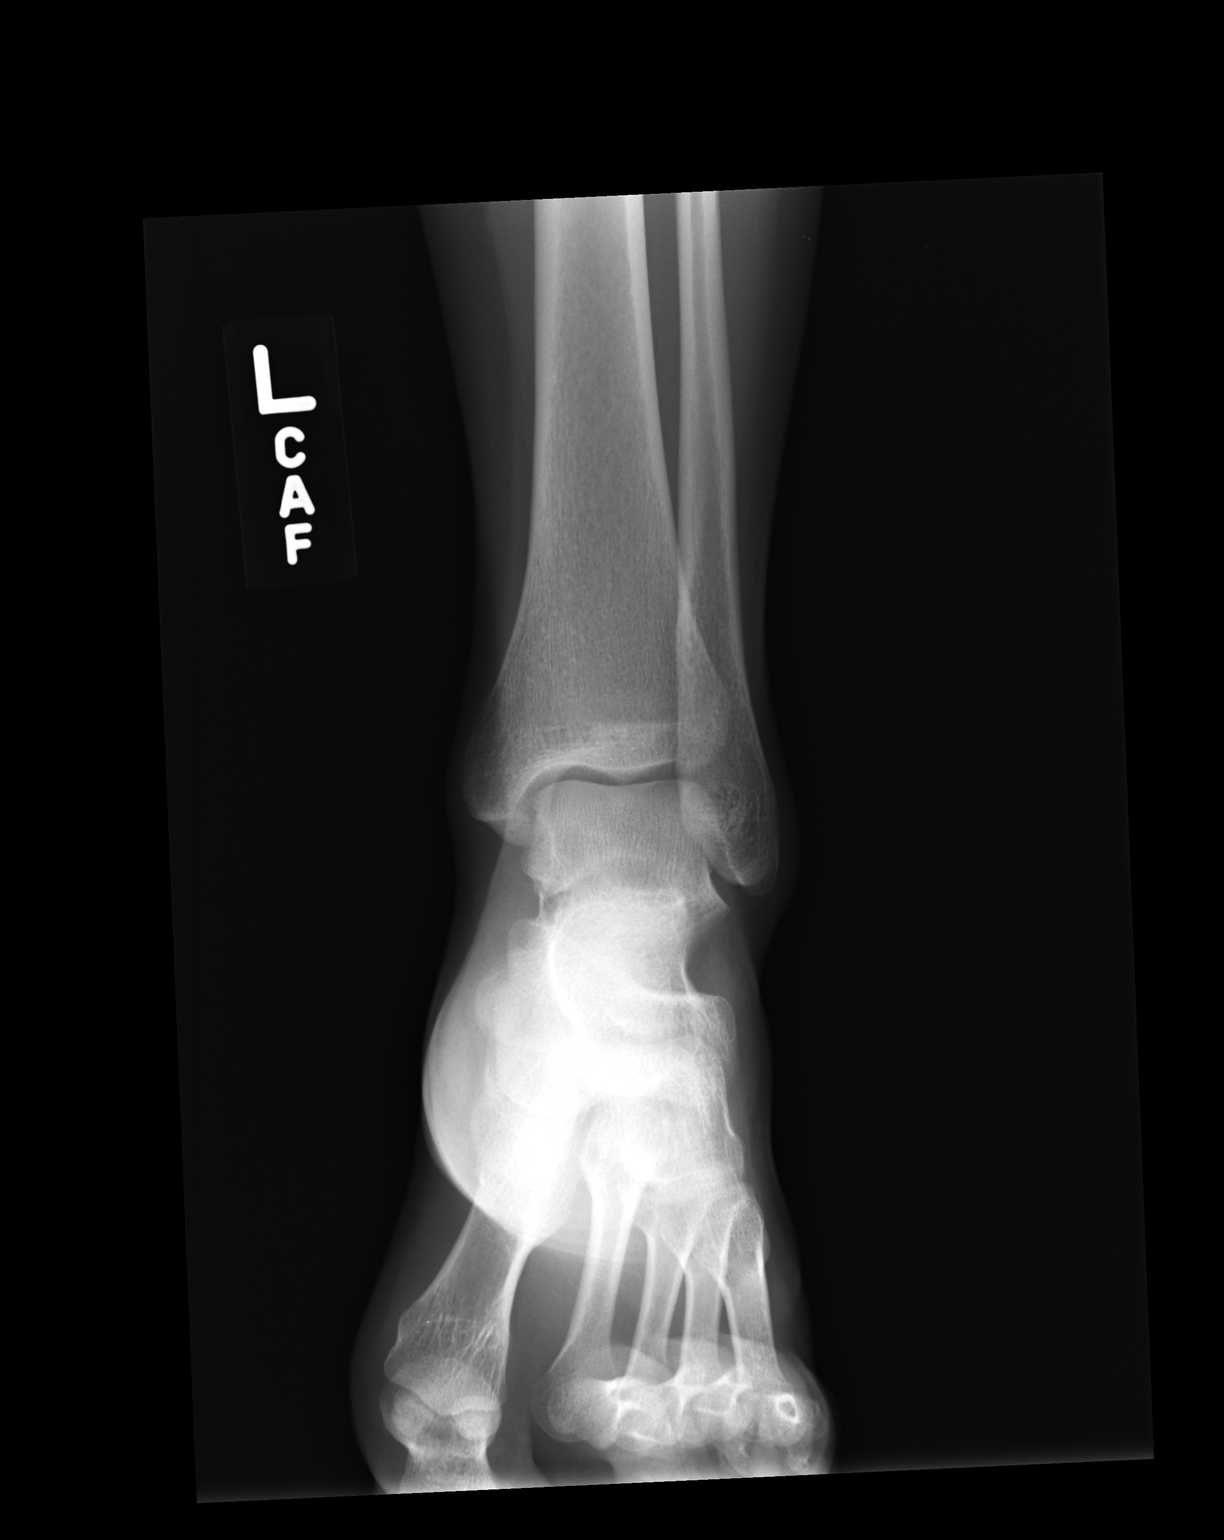

[3 of 3 positions shown; findings below may reference images not displayed]

FINDINGS: There is no evidence of fracture or other focal bone lesions. Soft
tissues are unremarkable.
IMPRESSION: Normal left tibia and fibula.

## 2016-01-17 ENCOUNTER — Ambulatory Visit: Payer: Managed Care, Other (non HMO) | Admitting: Neurology

## 2016-06-30 ENCOUNTER — Other Ambulatory Visit: Payer: Self-pay | Admitting: Obstetrics and Gynecology

## 2016-06-30 MED ORDER — DROSPIRENONE-ETHINYL ESTRADIOL 3-0.03 MG PO TABS: 1.0000 | ORAL_TABLET | Freq: Every day | ORAL | 0 refills | Status: AC

## 2016-06-30 NOTE — Telephone Encounter (Signed)
Medication refill request: OCP Last AEX:  08/02/15 Dr. Edward JollySilva  Next AEX: none Last MMG (if hormonal medication request): none Refill authorized: 09/20/15 #3packs/3R. Today #3pack/0R?  Called patient. [place] and will be looking for a new gyn soon. Requesting 90 day supply for insurance purposes.   Routed to Dr. Edward JollySilva

## 2016-06-30 NOTE — Telephone Encounter (Signed)
Patient need a 90 day supply on generic yaz sent to [facility] [place] at [phone].
